# Patient Record
Sex: Male | Born: 1955 | Race: White | Hispanic: No | Marital: Married | Smoking: Current every day smoker
Health system: Southern US, Community
[De-identification: ages and names within clinical notes are randomized; demographics above are authoritative.]

## PROBLEM LIST (undated history)

## (undated) DIAGNOSIS — K7460 Unspecified cirrhosis of liver: Secondary | ICD-10-CM

## (undated) DIAGNOSIS — F32A Depression, unspecified: Secondary | ICD-10-CM

## (undated) DIAGNOSIS — E119 Type 2 diabetes mellitus without complications: Secondary | ICD-10-CM

## (undated) DIAGNOSIS — M79673 Pain in unspecified foot: Secondary | ICD-10-CM

## (undated) DIAGNOSIS — F419 Anxiety disorder, unspecified: Secondary | ICD-10-CM

## (undated) DIAGNOSIS — F329 Major depressive disorder, single episode, unspecified: Secondary | ICD-10-CM

## (undated) DIAGNOSIS — I10 Essential (primary) hypertension: Secondary | ICD-10-CM

## (undated) HISTORY — DX: Anxiety disorder, unspecified: F41.9

## (undated) HISTORY — DX: Essential (primary) hypertension: I10

## (undated) HISTORY — DX: Unspecified cirrhosis of liver: K74.60

## (undated) HISTORY — DX: Depression, unspecified: F32.A

## (undated) HISTORY — DX: Pain in unspecified foot: M79.673

## (undated) HISTORY — DX: Major depressive disorder, single episode, unspecified: F32.9

## (undated) HISTORY — DX: Type 2 diabetes mellitus without complications: E11.9

---

## 2000-10-21 ENCOUNTER — Encounter: Payer: Self-pay | Admitting: Emergency Medicine

## 2000-10-21 ENCOUNTER — Emergency Department (HOSPITAL_COMMUNITY): Admission: EM | Admit: 2000-10-21 | Discharge: 2000-10-22 | Payer: Self-pay | Admitting: Emergency Medicine

## 2002-02-07 ENCOUNTER — Emergency Department (HOSPITAL_COMMUNITY): Admission: EM | Admit: 2002-02-07 | Discharge: 2002-02-07 | Payer: Self-pay | Admitting: Emergency Medicine

## 2002-02-07 ENCOUNTER — Encounter: Payer: Self-pay | Admitting: Emergency Medicine

## 2002-11-28 ENCOUNTER — Emergency Department (HOSPITAL_COMMUNITY): Admission: EM | Admit: 2002-11-28 | Discharge: 2002-11-28 | Payer: Self-pay | Admitting: Emergency Medicine

## 2003-09-25 ENCOUNTER — Emergency Department (HOSPITAL_COMMUNITY): Admission: EM | Admit: 2003-09-25 | Discharge: 2003-09-25 | Payer: Self-pay | Admitting: Emergency Medicine

## 2005-04-11 ENCOUNTER — Emergency Department (HOSPITAL_COMMUNITY): Admission: EM | Admit: 2005-04-11 | Discharge: 2005-04-11 | Payer: Self-pay | Admitting: Emergency Medicine

## 2005-12-02 ENCOUNTER — Ambulatory Visit: Payer: Self-pay | Admitting: Critical Care Medicine

## 2005-12-02 ENCOUNTER — Inpatient Hospital Stay (HOSPITAL_COMMUNITY): Admission: EM | Admit: 2005-12-02 | Discharge: 2006-01-19 | Payer: Self-pay | Admitting: Emergency Medicine

## 2005-12-06 ENCOUNTER — Ambulatory Visit: Payer: Self-pay | Admitting: Oncology

## 2005-12-10 ENCOUNTER — Encounter (INDEPENDENT_AMBULATORY_CARE_PROVIDER_SITE_OTHER): Payer: Self-pay | Admitting: *Deleted

## 2005-12-12 ENCOUNTER — Encounter: Payer: Self-pay | Admitting: Vascular Surgery

## 2005-12-14 ENCOUNTER — Ambulatory Visit: Payer: Self-pay | Admitting: Gastroenterology

## 2005-12-29 ENCOUNTER — Ambulatory Visit: Payer: Self-pay | Admitting: Physical Medicine & Rehabilitation

## 2005-12-30 ENCOUNTER — Encounter (INDEPENDENT_AMBULATORY_CARE_PROVIDER_SITE_OTHER): Payer: Self-pay | Admitting: Specialist

## 2006-01-09 ENCOUNTER — Ambulatory Visit: Payer: Self-pay | Admitting: Hematology & Oncology

## 2006-01-20 ENCOUNTER — Ambulatory Visit: Payer: Self-pay | Admitting: Oncology

## 2006-05-14 ENCOUNTER — Ambulatory Visit (HOSPITAL_COMMUNITY): Admission: RE | Admit: 2006-05-14 | Discharge: 2006-05-14 | Payer: Self-pay | Admitting: Gastroenterology

## 2006-05-17 ENCOUNTER — Encounter: Admission: RE | Admit: 2006-05-17 | Discharge: 2006-05-17 | Payer: Self-pay | Admitting: Gastroenterology

## 2006-11-19 ENCOUNTER — Encounter: Admission: RE | Admit: 2006-11-19 | Discharge: 2006-11-19 | Payer: Self-pay | Admitting: Gastroenterology

## 2008-03-03 IMAGING — CR DG CHEST 1V PORT
1 series · 1 of 1 positions shown · non-contrast
Comparison: None.

CLINICAL DATA: 49-year-old.  Hemorrhagic shock.  PICC line placement. 
 PORTABLE CHEST - 1 VIEW:

[view not recorded]
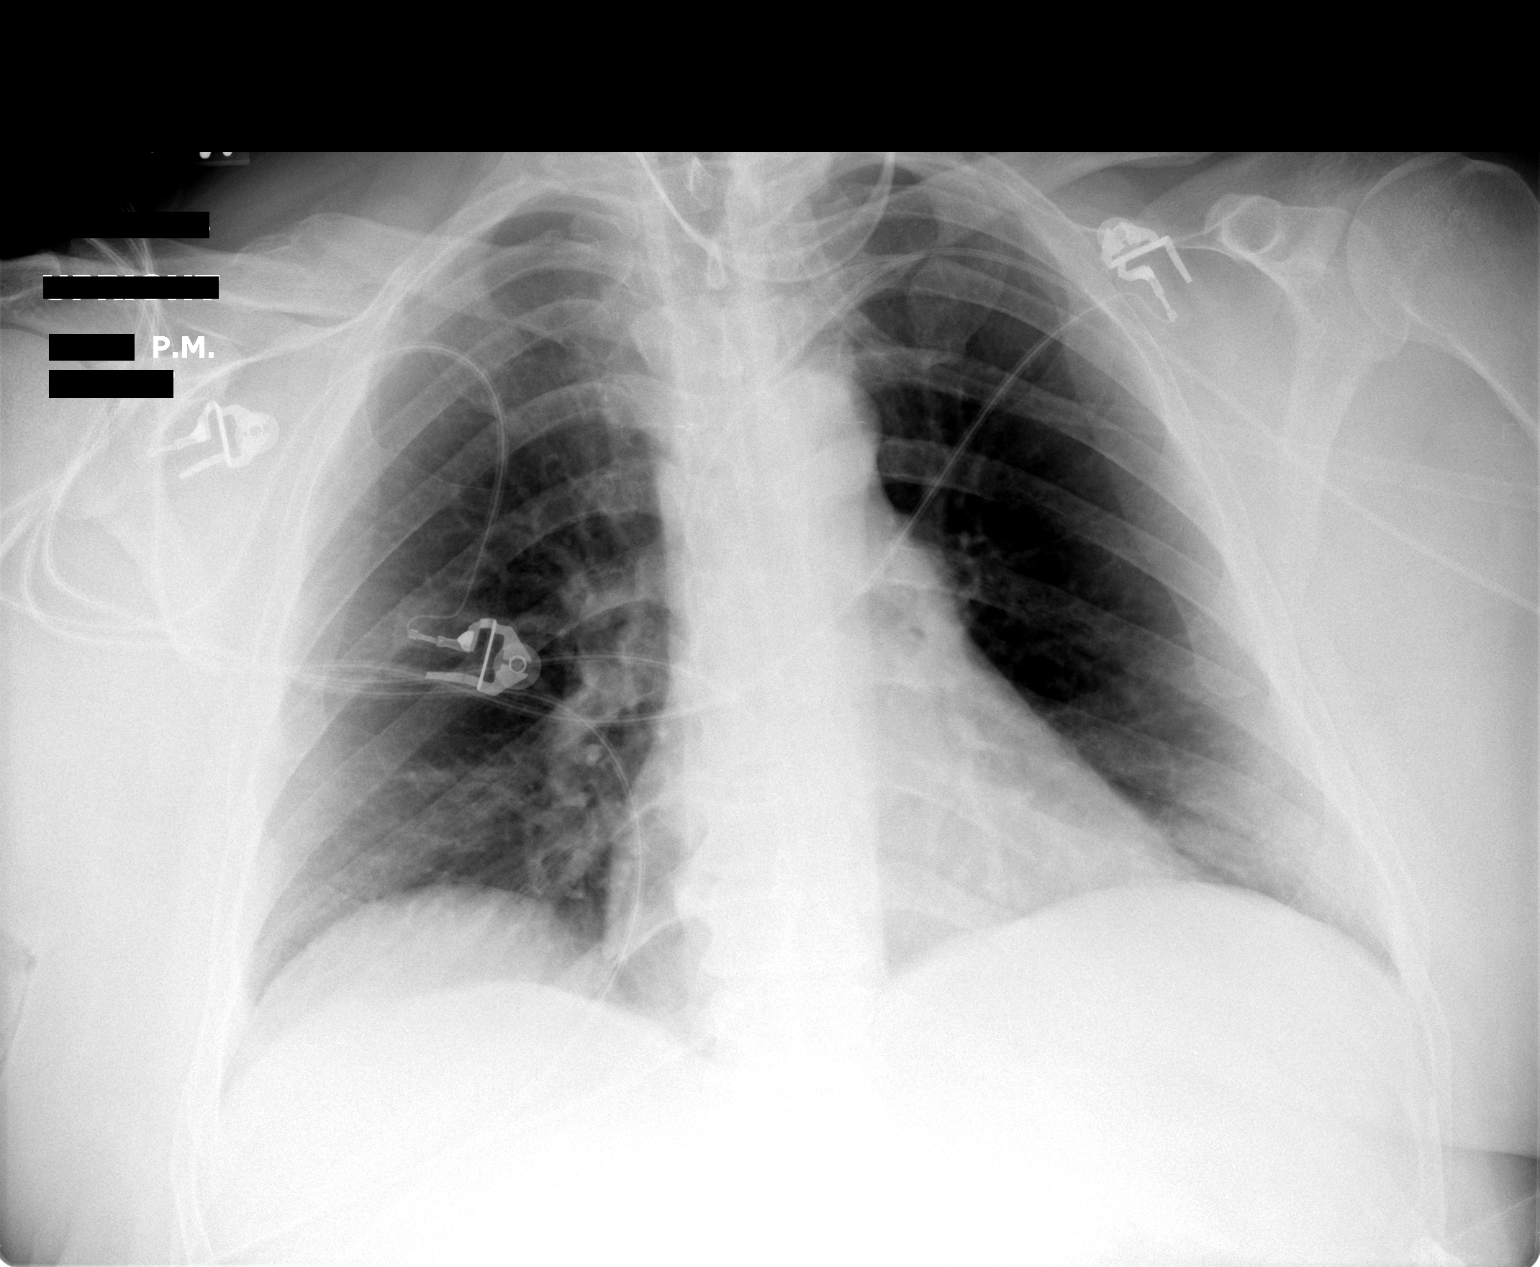

[1 of 1 positions shown; findings below may reference images not displayed]

FINDINGS: Cardiac silhouette, mediastinal, and hilar contours are within normal limits.  Lungs are clear of acute process.  Minimal streaky basilar atelectasis.  Mild eventration right hemidiaphragm.  Left PICC line is in the mid distal superior vena cava.
IMPRESSION: 1.  PICC line tip in good position in the mid distal superior vena cava.  
 2.  No acute pulmonary findings.  Minimal streaky basilar atelectasis.

## 2008-04-16 IMAGING — CR DG TOE GREAT 2+V*L*
1 series · 1 of 1 positions shown · non-contrast
Comparison: None.

CLINICAL DATA: Pain in left great toe.
 LEFT GREAT TOE ? 2 VIEWS:

[view not recorded]
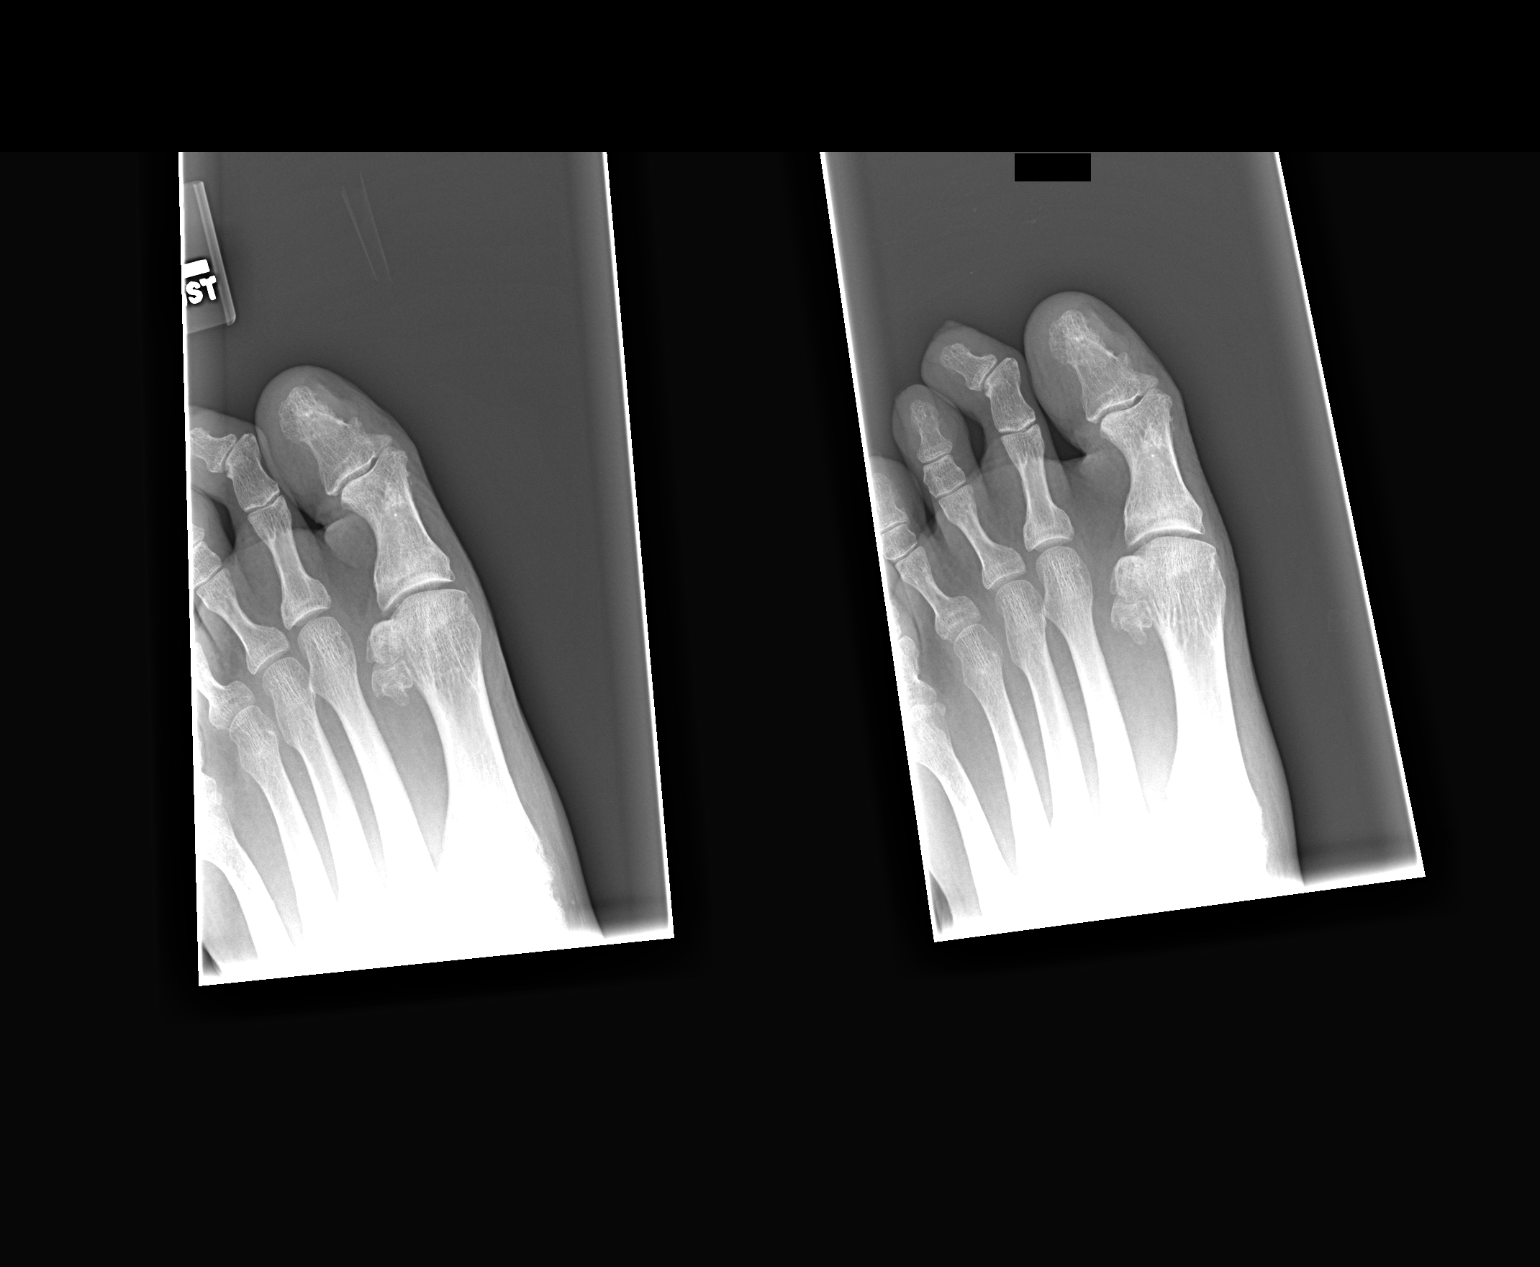

[1 of 1 positions shown; findings below may reference images not displayed]

FINDINGS: There are mild degenerative changes affecting the interphalangeal joint of the great toe.  No acute radiographic abnormalities are noted.  Specifically, I see no fractures or dislocations.
IMPRESSION: No acute findings.

## 2008-04-16 IMAGING — CR DG PORTABLE PELVIS
1 series · 1 of 1 positions shown · non-contrast
Comparison: None available.

CLINICAL DATA: Fall.  Hemorrhagic shock.  
 PORTABLE PELVIS ? 1 VIEW:

[view not recorded]
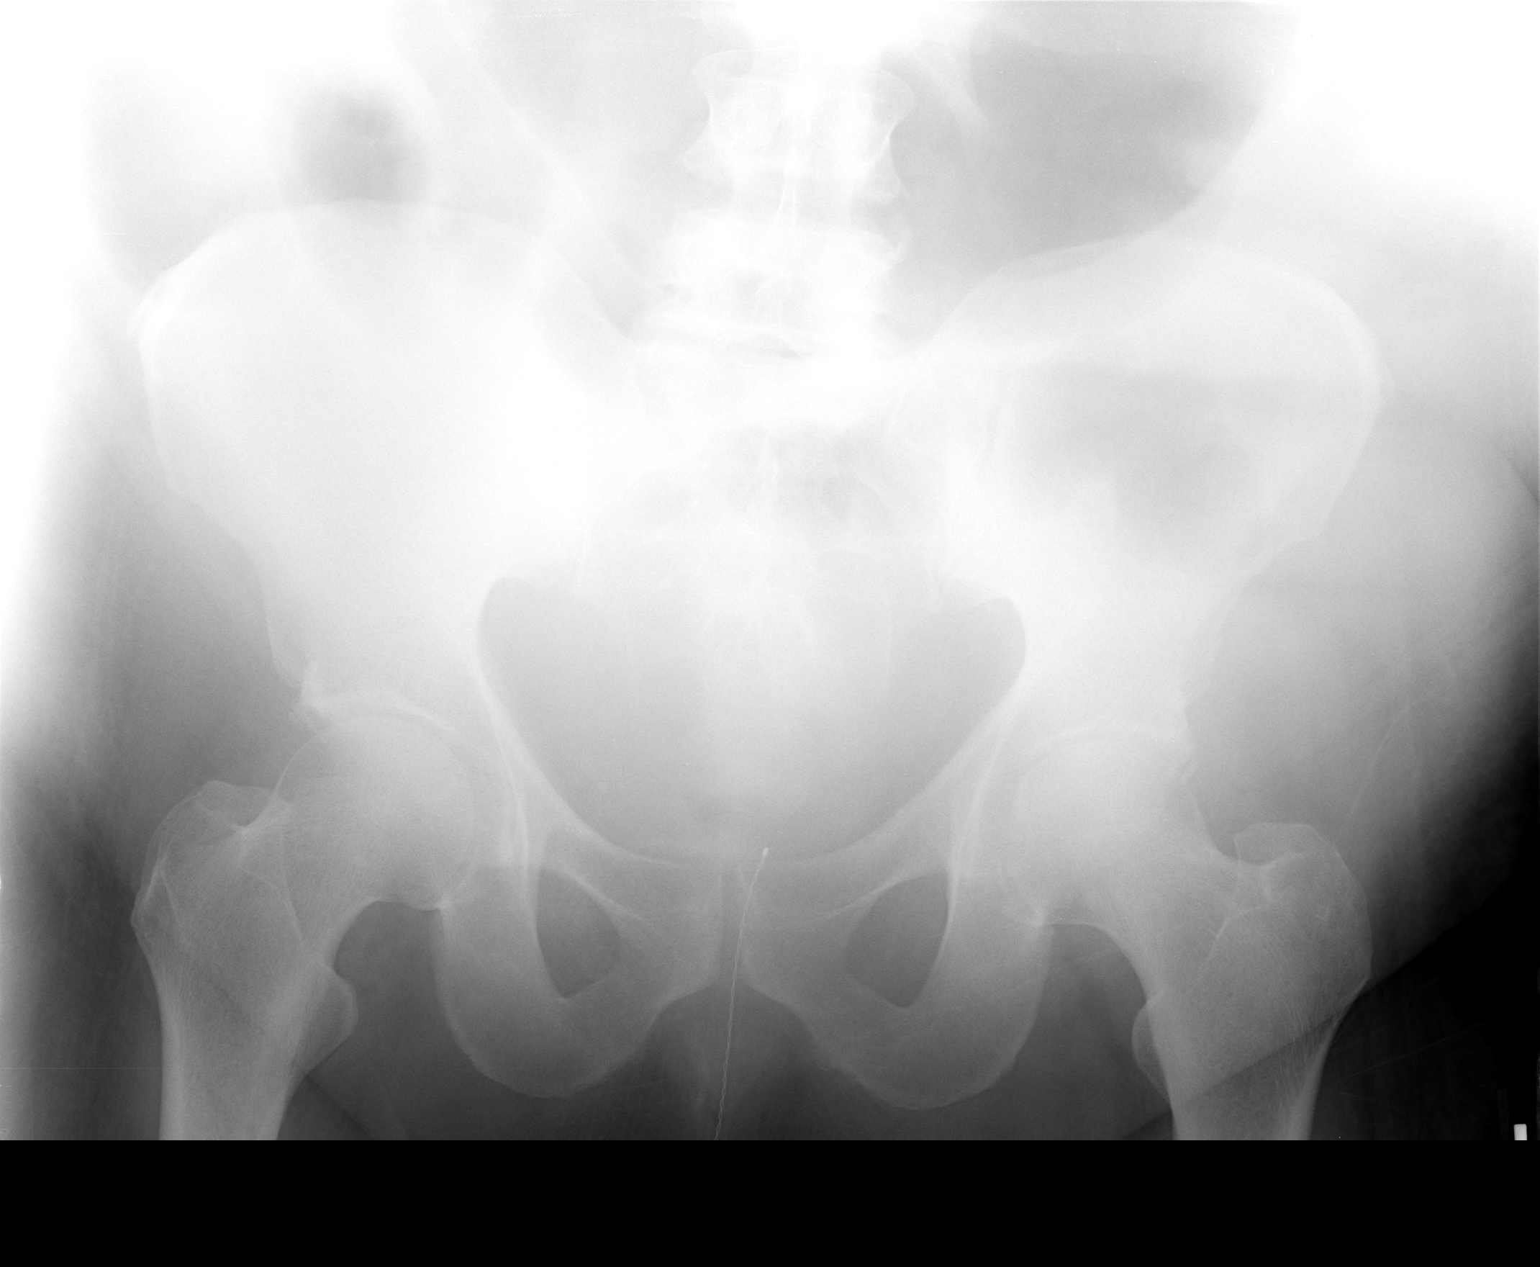

[1 of 1 positions shown; findings below may reference images not displayed]

FINDINGS: Exam is limited by patient?s body habitus. There are degenerative changes at the lumbosacral junction.  Both femoral heads are located.  Mild degenerative changes involve bilateral hips.
IMPRESSION: 1.  Moderately limited examination due to patient?s size.  No displaced fracture or dislocation.  
 2.  Lower lumbar spondylosis and degenerative changes of the hips.

## 2008-09-06 LAB — BASIC METABOLIC PANEL: Glucose: 159 mg/dL

## 2008-09-06 LAB — CBC AND DIFFERENTIAL: Hemoglobin: 13.4 g/dL — AB (ref 13.5–17.5)

## 2008-09-06 LAB — HEPATIC FUNCTION PANEL: AST: 46 U/L — AB (ref 14–40)

## 2009-11-27 LAB — HEPATIC FUNCTION PANEL: Alkaline Phosphatase: 2 U/L — AB (ref 25–125)

## 2010-04-26 ENCOUNTER — Ambulatory Visit (HOSPITAL_COMMUNITY): Admission: RE | Admit: 2010-04-26 | Discharge: 2010-04-26 | Payer: Self-pay | Admitting: Family Medicine

## 2010-08-31 ENCOUNTER — Encounter: Payer: Self-pay | Admitting: Emergency Medicine

## 2010-12-26 NOTE — H&P (Signed)
[name], MARSZALEK NO.:  [id number]   MEDICAL RECORD NO.:  [id number]          PATIENT TYPE:  INP   LOCATION:  1825                         FACILITY:  [facility]   PHYSICIAN:  Jonna L. Robb Matar, M.D.DATE OF BIRTH:  01/19/1956   DATE OF ADMISSION:  12/02/2005  DATE OF DISCHARGE:                                HISTORY & PHYSICAL   PRIMARY CARE PHYSICIAN:  Dr. Feliciana Rossetti   CHIEF COMPLAINT:  Nose bleeds for five days.   HISTORY:  The patient actually had an episode of epistaxis back in September  which has been intermittent since then.  However, last week he injured his  right arm with a huge bruise.  Five days ago he fell and injured his last  knee and that also bruised up and he has been having intermittent nose  bleeds to the point where four days ago he bled almost all of that day from  his nose.  He has been getting a little bit dizzy and lightheaded.  Also of  note, he was started on Lipitor about three or four weeks ago, was on it for  three weeks and then told to discontinue it.   PAST MEDICAL HISTORY:  1.  Type 2 diabetes.  2.  Alcoholism.  3.  Hypertension.   FAMILY HISTORY:  Hypertension and diabetes.   SOCIAL HISTORY:  Smokes a pack a day for over 30 years.  Alcohol intake is a  fifth of liquor a day plus one to two six-packs a day but he has not been  drinking in the past week.  He works maintenance.  Is married.  Has no  children.  Has no drug allergies.   MEDICATIONS:  1.  Benicar 40/25 daily.  2.  Metformin 500 daily.  3.  Just the last couple of days he got a prescription for some Valium and      for some vitamin K.   REVIEW OF SYSTEMS:  The patient feels a little bit dizzy.  His wife says she  has noticed he has been jaundiced for about three or four days.  He denies  hematuria.  No thyroid problems.  No heart disease.  Not been told of liver  problems.  Patient denies DTs.   PHYSICAL EXAMINATION:  GENERAL:  Well-nourished,  well-developed, very  jaundiced white male with blood trickling out of his right nostril.  HEENT:  Extraocular movements are full.  Dentition is fair.  There is no  thyromegaly, carotid bruits, or jugular venous distention.  LUNGS:  Clear to A&P without wheezing, rales, rhonchi, or dullness.  Respiratory effort is slightly increased.  HEART:  Tachycardic.  Normal S1, S2 without murmurs, rubs, or gallops.  There is no clubbing, cyanosis, edema.  ABDOMEN:  Slightly distended.  I cannot detect any hepatomegaly.  Bowel  sounds are present.  Stool is heme-positive.  PELVIC:  External genitalia is mature.  EXTREMITIES:  Huge ecchymosis from the right hand all the way up to the  right shoulder, another bruise on the left shoulder, another bruise left  knee.  There is  full range of motion, some mild arthritic changes.  NEUROLOGIC:  There is no tremor.  The patient is alert and oriented x3,  seems anxious.  He is alert and oriented x3.  Reflexes are 2+.  Toes go  down.   INITIAL LABORATORY WORK:  Some of the laboratory work is from today and some  from Dr. Jearl Klinefelter office yesterday.  White count 9.5, hemoglobin down to  5.1, platelet count only 68.  A+ blood.  INR 1.8.  PTT 39.  Other laboratory  work is pending.  From yesterday it is noted his bilirubin was 5.4.  Bilirubin one month prior was 2.9.   IMPRESSION:  1.  Hemorrhagic shock.  The patient's blood pressure is improving with bolus      and normal saline.  2.  __________ coagulopathy.  The patient will be transfused and given fresh      frozen plasma.  3.  Thrombocytopenia.  4.  Severe anemia secondary to acute and chronic blood loss.  5.  Alcoholism.  Discussed that the patient will need to get on to some      alcohol treatment program.  6.  Alcoholic hepatitis.  7.  History of hypertension.  Obviously, all his antihypertensives will be      held.  8.  Type 2 diabetes.  I am going to hold his Metformin and put him on a mild       sliding scale insulin as needed.      Jonna L. Robb Matar, M.D.  Electronically Signed     JLB/MEDQ  D:  12/02/2005  T:  12/02/2005  Job:  [id number]   cc:   Lacretia Leigh. Quintella Reichert, M.D.  [address]  [place]  [place] [address]

## 2010-12-26 NOTE — Discharge Summary (Signed)
[name], [name] NO.:  [id number]   MEDICAL RECORD NO.:  [id number]          PATIENT TYPE:  INP   LOCATION:  2909                         FACILITY:  [facility]   PHYSICIAN:  Hillery Aldo, M.D.   DATE OF BIRTH:  10-18-1955   DATE OF ADMISSION:  12/02/2005  DATE OF DISCHARGE:                                 DISCHARGE SUMMARY   INTERIM DISCHARGE SUMMARY   PRIMARY CARE PHYSICIAN:  Dr. Tinnie Gens C. Hooper   DISCHARGE DIAGNOSES:  1.  End-stage liver disease/cirrhosis.  2.  Hemorrhagic shock.  3.  Coagulopathy secondary to #1.  4.  Thrombocytopenia secondary to #1.  5.  History of alcohol abuse.  6.  Portal hypertension and varices.  7.  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  8.  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  9.  Acute renal failure.  10. Coagulase-negative Staphylococcus bacteremia.  11. Status post elective intubation for airway protection.  12. Dysphagia.  13. Hyperglycemia.  14. Hypernatremia.  15. Hyperchloremia  16. Anal fissure.  17. Gastrointestinal bleed secondary to ischemic colitis.  18. Stage 2 decubitus ulcer.  19. Anasarca.   DISCHARGE MEDICATIONS:  To be dictated at time of actual discharge.   CONSULTATIONS:  1.  Dr. Yvonna Alanis of orthopedics.  2.  Dr. Pierce Crane of hematology.  3.  Dr. Flo Shanks of ENT.  4.  Dr. Antonietta Breach of psychiatry.  5.  Dr. Juanda Chance of gastroenterology.   For complete list of the history of present illness, procedures and  diagnostic studies, and hospital course through Dec 15, 2005, please see the  previously dictated interim discharge summary.  This interim summary will  cover the period of time from Dec 26, 2005, through January 09, 2006.   ADDITIONAL PROCEDURES AND DIAGNOSTIC STUDIES:  1.  Swallowing evaluation on Dec 25, 2005, showed moderate oropharyngeal      dysphagia.  There was laryngeal penetration to vocal cords with honey      thick  and delayed cough secondary to reduced laryngeal elevation.  There      was flash penetration observed with honey via the cup while performing a      chin tuck.  Recommendations were made for a dysphagia 1 pureed diet with      honey thickened liquids and full supervision with aspiration      precautions.  All medications are to be crushed and given in pureed      texture.  He is to avoid straws and remain seated upright 90 degrees for      p.o. intake.  2.  Flexible sigmoidoscopy on Dec 30, 2005, done by Dr. Lina Sar showed a      large anal fissure which was not bleeding at the time of the exam.      There was evidence of ischemic colitis in the right colon and bleeding      throughout the transverse and descending colon.  Biopsies were taken      with the results showing inflamed granulation tissue with  no      identifiable mucosal surface.  The differential by pathologic criteria      included ischemic colitis versus NSAID-related mucosal injury.   DISCHARGE LABORATORY VALUES:  To be dictated at the time of actual  discharge.   HOSPITAL COURSE:  #1 - ELECTIVE INTUBATION FOR AIRWAY  PROTECTION/RESPIRATORY.  The patient had been extubated on Dec 23, 2005.  He  was initially intubated for airway protection secondary to encephalopathy  and epistaxis.  Once extubated, the patient's respiratory status remained  stable and he maintained good O2 saturation on nasal cannula oxygen.  He  remains at high risk for aspiration and decompensation but a he is stable  through January 09, 2006.   #2 - ACUTE BLOOD LOSS ANEMIA SECONDARY TO COAGULOPATHY, THROMBOCYTOPENIA,  AND ISCHEMIC COLITIS.  The patient had a very rocky course over the past 2  weeks with recurrent episodes of hematochezia and significant blood loss  anemia.  He had to be frequently transfused with packed red blood cells,  fresh frozen plasma, cryoprecipitate, platelets, and also received NovoSeven  on more than one occasion.  His  hemoglobin would stabilize and then he would  have an episode of re-bleeding.  At this time, he has been stable with no GI  bleeding for approximately 3 days.  He is currently on Amicar by the  recommendations of the hematologist and this may be helping with hemostasis.  He continues to require aggressive support with fresh frozen plasma for his  significant coagulopathy.  His thrombocytopenia has remained stable.  The  plan at this point is to transfuse for any hemoglobin of less than 8, to  transfuse fresh frozen plasma as needed for coagulopathy, and to transfuse  platelets if his platelet count is less than 50 with an episode of active  bleeding.   #3 - CIRRHOSIS/END-STAGE LIVER DISEASE/HYPERAMMONEMIA.  The patient was  taken off lactulose secondary to concerns that this may be contributing to  his ongoing problem with GI bleeding.  He was put on neomycin with good  control of his hyperammonemia.  He was not encephalopathic for the past 2  weeks.  Once he was hemodynamically stable, a spironolactone was added.  At  this point, we are attempting to titrate this along with Lasix for optimal  management of his significant anasarca.   #4 - HYPERGLYCEMIA.  The patient has had episodes of hyperglycemia.  When  his condition deteriorated, the sliding scale insulin and CBG checks were  discontinued due to patient refusing.  At this point, we have been  monitoring his morning glucose which has been fine.  We have not  reinstituted sliding scale insulin but consideration could be made for  restarting this if indicated.   #5 - DYSPHAGIA.  The patient was seen by the speech therapist who  recommended a dysphagia diet with honey thickened liquids.  He remains weak  and at high risk for aspiration.   #6 - DECUBITUS ULCER/ANAL FISSURE.  The patient has a stage 2 decubitus  ulcer and a nonbleeding anal fissure.  He was put on Anusol and the nursing staff cares for his decubitus ulcer.  We also  placed him on a KinAir bed to  help prevent any further deterioration of his skin.   #7 - FLUID/ELECTROLYTE/NUTRITION.  The patient has had various electrolyte  imbalances all of which were addressed as appropriate.  His electrolytes are  currently stable.  We will continue to monitor then periodically.   #8 -  ANASARCA.  Once the patient was hemodynamically stable, attempts were  made to discontinue his IV fluids and restart his diuretic therapy.  At this  point, we have reinstituted spirolactone and will began to try low-dose  Lasix with titration upward for relief of his significant anasarca.   DISPOSITION:  The patient remains in the step-down unit and continues to  want very aggressive care.  An ethics committee meeting was held when the  patient deteriorated significantly and had hemorrhagic shock with recurrent  episodes of GI bleeding.  At that point, it was unclear that we would be  able to stop the bleeding and it was felt that in the event of such  occurrence, the patient would likely go into cardiopulmonary arrest.  Because his risk of surviving a code situation was essentially limited, an  ethics committee meeting was held because of insistence by the part of the  patient's wife that we do everything possible to save her husband's life.  During the meeting, the patient signed over power of attorney to his wife  and she continues to want full support in the event of a catastrophic event.  Given this, we have continued to support the patient with multiple blood  products and step-down unit level of care.  Despite these interventions, the  patient's prognosis remains very poor in that he has advanced end-stage  liver disease and the risk of a bleeding episode is magnified.      Hillery Aldo, M.D.  Electronically Signed     CR/MEDQ  D:  01/10/2006  T:  01/11/2006  Job:  [id number]   cc:   Lacretia Leigh. Quintella Reichert, M.D.  [address]  [place]  [place] [address]

## 2010-12-26 NOTE — Op Note (Signed)
[name], WESTENBERGER NO.:  [id number]   MEDICAL RECORD NO.:  [id number]          PATIENT TYPE:  INP   LOCATION:  5713                         FACILITY:  [facility]   PHYSICIAN:  Loreta Ave, M.D. DATE OF BIRTH:  08/29/1955   DATE OF PROCEDURE:  12/06/2005  DATE OF DISCHARGE:                                 OPERATIVE REPORT   PREOPERATIVE DIAGNOSIS:  Marked hematoma with distal edema, right arm  secondary to hepatic coagulopathy.   POSTOPERATIVE DIAGNOSIS:  Marked hematoma with distal edema, right arm  secondary to hepatic coagulopathy with a large hematoma under pressure  mostly within the short head biceps tendon.   OPERATION PERFORMED:  Evacuation, irrigation, removal hematoma right arm.   SURGEON:  Loreta Ave, M.D.   ASSISTANT:  Aura Fey. Bobbe Medico.   ANESTHESIA:  General.   ESTIMATED BLOOD LOSS:  Minimal except for the drain hematoma.   SPECIMENS:  None.   CULTURES:  None.   COMPLICATIONS:  None.   DRESSING:  Soft compressive with sling and a compressive wrap from the wrist  up to the shoulder.   DESCRIPTION OF PROCEDURE:  The patient was brought to the operating room and  after adequate anesthesia had been obtained, placed in beach chair position  on the shoulder positioner and prepped and draped in the usual sterile  fashion.  I made a longitudinal incision just over the short head biceps  tendon just to front margin of the deltoid.  Skin and subcutaneous tissue  divided just below the deltopectoral groove.  Blunt dissection used to  expose the biceps right over the short head.  This was opened longitudinally  and evacuation of a large hematoma that was under pressure although mostly  solid.  This was very consistent with what they saw on the scan.  This was  thoroughly evacuated protecting neurovascular structures.  Once I had  cleared out all of the new and old hematoma from that area, the entire area  was much softer.  Compression on  all of the venous and neurovascular  structures much improved after removal of the hematoma. Fortunately, there  was no  necrotic material and all the muscle around this appeared to be  viable.  Copious irrigation with a pulse lavage.  When that was complete, we  then closed the subcutaneous tissue and the skin with staples.  I did not  place a drain deep because his acute bleeding hopefully had completely  resolved with correction of his coagulopathy.  I felt placing a drain may  place him at more risk for  bleeding than not.  Once the dressing was in place, we then placed a  compressive wrap on the hand all the way up to the wound.  When that was  complete, anesthesia reversed.  Brought to recovery room.  Tolerated surgery  well without complication.      Loreta Ave, M.D.  Electronically Signed     DFM/MEDQ  D:  12/06/2005  T:  12/07/2005  Job:  [id number]

## 2010-12-26 NOTE — Consult Note (Signed)
NAMEBRAIDEN, Simpson NO.:  [id number]   MEDICAL RECORD NO.:  [id number]          PATIENT TYPE:  INP   LOCATION:  5713                         FACILITY:  [facility]   PHYSICIAN:  Pierce Crane, M.D.      DATE OF BIRTH:  [birth year]   DATE OF CONSULTATION:  12/05/2005  DATE OF DISCHARGE:                                   CONSULTATION   HEMATOLOGY-ONCOLOGY CONSULTATION:   HISTORY OF PRESENT ILLNESS:  This is a 55 year old gentleman with well known  history of alcohol abuse who presents with several-day history of epistaxis  with a hemoglobin of 5.  He had previously had this cauterized as an  outpatient and came into the hospital when he was unable to slow the  bleeding down.  He was seen in the emergency room and had his nose packed by  Dr. Lazarus Salines in hopes this would slow his bleeding down.  He was also noted  to have some pain and swelling of his right arm.  About 2-3 weeks ago he had  a pulled a fence post out of the ground and he developed some pain in his  arm and then a few days ago this began to swell quite extensively.  He  subsequently has had while in hospital at least 8-9 units of packed red  blood cells.  He has had about 4-6 units of fresh frozen plasma, vitamin K  x1, despite that coagulation status continues to be fully  abnormal and he  has ongoing thrombocytopenia.   PAST MEDICAL HISTORY:  History of hypertension, type 2 diabetes.   MEDICATIONS:  Benicar and metformin.   SOCIAL HISTORY:  He smokes a pack a day for the past 30 years.  His alcohol  intake is about a fifth of liquor a day for as many years as he can  remember.  He is married and has no children.  He has__________  .  No  history of bleeding diathesis.   ALLERGIES:  NO DRUG ALLERGIES.   REVIEW OF SYSTEMS:  He denies any pruritus, denies any headaches, no blurry  vision, denies any shortness of breath or cough, he has been swallowing  quite a blood, he is somewhat nauseated and has  __________, abdominal pain,  denies any blood per rectum, denies any hematuria or any other problems with  bleeding.   PHYSICAL EXAMINATION:  GENERAL:  This is a pleasant gentleman who is anxious  and in some distress.  VITAL SIGNS:  Blood pressure is 178/56, temperature is 98.6, pulse 98,  respiratory rate 20.  HEENT:  He has scleral icterus.  He has slight jaundice.  He has no palpable  peripheral adenopathy.  LUNG:  Clear.  HEART:  Heart sounds are normal.  EXTREMITIES:  His right arm is diffusely swollen and ecchymotic.  He can  raise the arm somewhat and he has moderate hand grips.  He has good pulses  distally.  His left arm is normal.  He has no palpable hepatosplenomegaly,  no adenopathy, minimal peripheral edema.  He has no obvious petechiae on his  lower extremities.   LABORATORIES:  Currently after his blood products his CBC shows a white  count of 6.5, hemoglobin of 10 and platelet count of 64,000.  Post-  transfusion platelets is now 79,000.  DIC panels are done.  INR is 1.9, PT  is 21.9, PTT of 39 and fibrinogen 354, D-dimer is 13.6.  Schistocytes were  not seen on smear.  His __________.  His chemistries show bilirubin of 6.8,  AST and ALT are somewhat elevated at 23 and 43, respectively.   IMPRESSION:  [name] is an unfortunate gentleman who presents with  coagulopathy secondary to alcohol use.  I should note that a CT scan of the  abdomen and pelvis has shown evidence of cirrhosis with evidence of portal  hypertension and varices seen.  He currently has had multiple blood products  with some improvement of his hemoglobin.  Unfortunately he remains  coagulopathic with an elevated INR.  He continues to have some intermittent  epistaxis despite having this area cauterized x2.  He has been seen in  consultation by orthopedics.  There is a concern that he has a large  hematoma and an MRI scan has been planned.  The orthopedic surgeons are  concerned about taking him to  the emergency room for evacuation of the  hematoma as he is at somewhat increased risk for bleeding.  I discussed with  him the use of Novo-7 possibly to slow this process down and stabilize the  hematoma.  This can also be used pre- and postop to control bleeding so that  he would be able to have this area evacuated.  We discussed the small risk  of using this medication in terms of possibility of a stroke.  We will await  results of the MRI and then make decision later on about the use of Novo-7.      Pierce Crane, M.D.  Electronically Signed     PR/MEDQ  D:  12/05/2005  T:  12/05/2005  Job:  [id number]   cc:   Lacretia Leigh. Quintella Reichert, M.D.  [address]  [place] [address]   Loreta Ave, M.D.  Fax: [phone]   Gloris [place]. Lazarus Salines, M.D.  Fax: [phone]

## 2010-12-26 NOTE — Discharge Summary (Signed)
[name], [name] NO.:  [id number]   MEDICAL RECORD NO.:  [id number]          PATIENT TYPE:  INP   LOCATION:  5713                         FACILITY:  [facility]   PHYSICIAN:  Isidor Holts, M.D.  DATE OF BIRTH:  July 20, 1956   DATE OF ADMISSION:  12/02/2005  DATE OF DISCHARGE:                                 DISCHARGE SUMMARY   PMD:  Lacretia Leigh. Quintella Reichert, M.D.   For diagnoses, refer to interim discharge summary dated December 06, 2005, by  Dr. Mamie Levers.  In addition, the following is pertinent:  Porta-  systemic encephalopathy.   PROCEDURE:  1.  Abdominal ultrasound scan dated Dec 11, 2005.  This showed a tiny amount      of perihepatic ascites.  No drainable collection present.  Findings      consistent with known cirrhosis.  Limited study due to the presence of a      large amount of bowel gas.  2.  Upper GI endoscopy dated Dec 10, 2005 by Dr. Melvia Heaps.  This showed      erosions in the duodenal bulb, erythematous mucosa, friable mucosa,      edema present.  Biopsy taken.  In the body of the stomach, there were      erosions present.  These were few and superficial with old clots.  There      was also retained food found in the body of the stomach.  Findings were      consistent with duodenitis without hemorrhage, gastritis chronic.      Pathology report confirmed chronic peptic duodenitis.  No evidence for      H. Pylori.   CONSULTATIONS:  1.  Dr. Pierce Crane, hematology/oncology.  2.  Dr. Melvia Heaps, gastroenterology.  3.  Dr. Arline Asp, hematology.  4.  Dr. Mckinley Jewel, orthopedic surgeon.   For admission history and detailed clinical course, refer to interim  discharge summary dated December 06, 2005 by Dr. Mamie Levers. However, in  the succeeding few days, i.e., from December 07, 2005 to Dec 15, 2005, the  following are pertinent:  The patient underwent operative evacuation of  right upper extremity hematoma on December 06, 2005 by Dr. Mckinley Jewel,  orthopedic surgeon.  This was an uncomplicated procedure.  For details,  refer to operative note of December 06, 2005, dictated by Dr. Eulah Pont.  The  patient was managed with elevation of the affected upper extremity.  Pain  and swelling subsided.  Overall, no further bleeding episodes were noted.  Epistaxis resolved.  The patient's hemoglobin level trended down, remaining  relatively stable, averaging 8.0 and 8.7.  On Dec 09, 2005, he was transfused  with 1 unit PRBCs, resulting in a post transfusion hemoglobin level of 8.9.  Hemoglobin level has remained relatively stable around this number, ever  since.  The patient continues to have tense abdominal distention which  appears to be gaseous, although he is able to move his bowels without any  problem.  At some point, it was felt this may be secondary to significant  ascites.  Abdominal  ultrasound scan was therefore ordered, with a view to  performing a paracentesis and instituting diuretic treatment, should this be  marked ascites. However, abdominal ultrasound scan of Dec 11, 2005 showed  only a tiny amount of perihepatic ascites, with no drainable collection  present.  Gastroenterologic consultation was requested, which was kindly  provided by Dr. Melvia Heaps, who recommended no diuretic treatment since  there was no ascites, however, he agreed with management of the patient so  far, with beta blockade against a background of cirrhosis and portal  hypertension, as well as lactulose for porto-systemic encephalopathy, as  evidenced by the patient's depressed sensorium/drowsiness and  hyperammonemia.  He performed an EGD on Dec 10, 2005, which revealed chronic  gastritis and duodenitis.  Biopsies were taken, but pathology report was  negative for H. Pylori.  There was no active bleeding.  Other subject of  concern is, of course, the presence of a right mid colonic lesion.  It was  noted on prior abdominal CT scan.  Per gastroenterology,  it was thought the  patient may not be able to tolerate a colonoscopy.  Conservative approach is  therefore recommended, and this will necessitate barium enema examination.  This is being arranged, and of course findings will dictate the next step.   On Dec 12, 2005, the patient was noticed to have marked swelling/fluctuance  of the left leg, with extensive bruising posteriorly up to the thigh.  The  right leg was less edematous.  This raised the possibility of an old  hematoma in the affected extremity, although there appeared to be no  vascular compromise.  Pulses were palpable.  Patient did not complain of  numbness or tingling.  Lower extremity venous Dopplers were done, and this  showed no evidence of DVT, or obvious hematoma bilaterally.  The patient's  blood pressure has been well controlled during the course of his hospital  stay.  There has been no overt bleeding.  INR remains elevated at > 2,  although the patient has had courses of vitamin K.  Renal indices have  remained stable.  As of Dec 12, 2005, BUN was 35 with a creatinine of 1.1.   DISPOSITION:  This will be elucidated in detail, at the time of actual  discharge, by discharging MD. However, overall prognosis appears to be poor,  both in the immediate and the long term.  This is compounded by the fact  that the patient is not entirely compliant with his lactulose and continues  intermittently refusing this, militating against resolution of his  encephalopathy.  The poor prognostic outlook has been discussed with the  patient's family.  Concerns have been raised, about the fact that the  patient will certainly not be able to go back to his regular occupation and  will need disability.  Also, they are somewhat concerned about paying  hospital bills.  Social worker consultation has therefore been requested.  The social worker has made preliminary contact with the family and will work with them to address these issues.   Furthermore, due to obvious unfavorable  prognosis, the time has probably come to start thinking about disposition of  this patient, as it appears that no dramatic recovery is in the offing.  I  have had preliminary discussions with the patient's spouse, about the  possibility of SNF versus hospice or in the  alternative,  considering a SNF, and then should patient further  deteriorate, transitioning to hospice.  It is, of course, going  to be  necessary to have a discussion with the patient himself, at some point.  This discussion was held with the patient's spouse, [name].  Her  telephone numbers are [phone] and [phone].      Isidor Holts, M.D.  Electronically Signed     CO/MEDQ  D:  12/15/2005  T:  12/15/2005  Job:  [id number]   cc:   Lacretia Leigh. Quintella Reichert, M.D.  [facility]  [place] [address]   Pierce Crane, M.D.  Fax: [phone]   Samul Dada, M.D.  Fax: [phone]   Loreta Ave, M.D.  Fax: [phone]   Barbette Hair. Arlyce Dice, M.D. [facility]  [address]  [place]  [place] [address]

## 2010-12-26 NOTE — Discharge Summary (Signed)
NAMEBELEN, ZWAHLEN NO.:  [id number]   MEDICAL RECORD NO.:  [id number]          PATIENT TYPE:  INP   LOCATION:  6731                         FACILITY:  [facility]   PHYSICIAN:  Isidor Holts, M.D.  DATE OF BIRTH:  02-11-1956   DATE OF ADMISSION:  12/02/2005  DATE OF DISCHARGE:  01/19/2006                                 DISCHARGE SUMMARY   PRIMARY MEDICAL DOCTOR:  Lacretia Leigh. Quintella Reichert, M.D., [facility],  [facility], telephone number [phone].   For discharge diagnoses, refer to interim discharge summary dated January 10, 2006, by Hillery Aldo, M.D.   DISCHARGE MEDICATIONS:  1.  Folic acid 1 mg p.o. daily.  2.  Protonix 40 mg p.o. daily.  3.  Thiamine 100 mg p.o. daily.  4.  Multivitamin one p.o. daily.  5.  Anusol-HC rectal suppository, insert one in the rectum b.i.d.  6.  Lactulose 30 mL p.o. b.i.d.  7.  Amicar 1000 mg p.o. q.i.d.  8.  Aldactone 50 mg p.o. t.i.d.  9.  Vitamin K 1 mg p.o. daily.  10. Atenolol 25 mg p.o. daily.  11. Ciprofloxacin 750 mg p.o. weekly (on Fridays).  12. Lasix 80 mg p.o. b.i.d.  13. K-Dur 20 mEq p.o. daily.  14. Ensure chocolate pudding 113 mL p.o. three times daily as nutritional      supplement.  15. Dysphagia III diet with nectar-thick.   For procedures, refer to interim discharge summaries dated December 06, 2005,  by [place] L. Robb Matar, M.D., Dec 15, 2005, by Isidor Holts, M.D., and January 10, 2006, by Dr. Hillery Aldo.   For consultations, refer to interim discharge summary dated January 10, 2006, by  Dr. Hillery Aldo.   For admission history and detailed clinical course, refer to above-mentioned  interim discharge summaries.  In addition, the following are pertinent, for  the period from January 11, 2006, until the date of this dictation.  The  patient's clinical condition remained relatively stable.  He has continued  on vitamin supplements and Amicar per recommendations of hematologist and  his sacral  decubitus ulcer was managed with local care by the wound  management team.  As a matter of fact, the wound care nurse evaluated the  wound on January 19, 2006, and felt that there was 100%  yellow slough with  yeast at the wound edges.  She further discussed home care for the wound,  with the patient's wife and informed her of the plan of care for dressing  change.  Recommended resuming Accuzyme, until the wound bed is clean and  cover the dressings with Tegaderm in order to prevent stool draining into  the wounds, also antifungal powder to surrounding areas was recommended.  Speech pathologist has also evaluated patient and has recommended a D-III  dysphagia diet with nectar-thick.  The patient continues on diuretics for  anasarca and ascites.  As a matter of fact, on January 18, 2006, his Lasix was  increased to 80 mg twice a day because of this.  On January 19, 2006, there was  appreciable diminution  in lower extremity edema, although his abdomen  continues to remain distended with dilated superficial veins, consistent  with portal hypertension and ascites.  Potassium supplements have been added  to his treatment regimen in view of the large dose of diuretics, however, it  is clear that his primary MD will have to follow his electrolytes, monitor  the patient's weight and adjust medications as appropriate.  There has been  no relapse of bleeding with epistaxis, or otherwise during the above-  mentioned period, although the patient continues to show hematologic  features of coagulopathy, including an elevated INR, on January 19, 2006, this  was 2.2, and also thrombocytopenia.  The patient is, of course,  deconditioned, however, he is undergoing physical therapy/occupational  therapy, which will be carried out in the home environment per patient's  wishes.  The patient shows no clinical features of encephalopathy at the  present time, i.e., no asterixis, no constructive dyspraxia.  He appears  fully  alert and oriented and able to communicate effectively.  Ammonia level  on January 19, 2006, was 87.  He continues on lactulose in b.i.d. dosage.   DISPOSITION:  The patient was considered clinically stable and sufficiently  recovered to be discharged on January 19, 2006.  We have discussed the  possibility of discharge to short-term SNF and finally to home, but the  patient and spouse, emphatically declined this, in part due to financial  considerations and he insisted on being discharged home today.  He has  accepted PT/OT in the home environment and arrangements have been put in  place to provide him with a home health aide, RN as well as wound care.  We  have also contacted Dr. Theron Arista Rubin's office, and his scheduler, has agreed  to call patient on January 20, 2006, to schedule an outpatient appointment for  follow-up of his coagulopathy.   DIET:  Dysphagia III with nectar.   ACTIVITY:  As tolerated, otherwise by PT/OT.   WOUND CARE:  Daily dressings with Accuzyme and dry gauze, dressings with  Tegaderm, and also apply antifungal powder to focal areas.   FOLLOW-UP INSTRUCTIONS:  The patient has been instructed to follow up with  his primary MD, Dr. Feliciana Rossetti, within 1 week of discharge.  He is to  call for an appointment.  He is also to follow up with Dr. Pierce Crane,  hematology, per appointment which will be scheduled by Dr. Renelda Loma office,  which has undertaken to contact the patient on January 20, 2006.   SPECIAL INSTRUCTIONS:  Home health aide, home health RN/PT/OT and wound care  has been arranged.  All this has been communicated to the patient.  He has  verbalized understanding.      Isidor Holts, M.D.  Electronically Signed     CO/MEDQ  D:  01/19/2006  T:  01/19/2006  Job:  [id number]   cc:   Lacretia Leigh. Quintella Reichert, M.D.  [address]  [place] [address]   Pierce Crane, M.D.  Fax: [phone]  Lina Sar, M.D. [facility]  [address]  [place]  [place]  [address]

## 2010-12-26 NOTE — Consult Note (Signed)
NAMEHONG, MORING NO.:  [id number]   MEDICAL RECORD NO.:  [id number]          PATIENT TYPE:  INP   LOCATION:  2108                         FACILITY:  [facility]   PHYSICIAN:  Zola Button T. Lazarus Salines, M.D. DATE OF BIRTH:  1956-06-04   DATE OF CONSULTATION:  12/02/2005  DATE OF DISCHARGE:                                   CONSULTATION   CHIEF COMPLAINT:  Epistaxis.   HISTORY:  55 year old white male with alcohol induced medical problems  hospitalized for anemia felt to be related to chronic epistaxis.  He has  been bleeding for several days this time, and with multiple bleeding events  in the past.  According to Dr. Robb Matar, he has seen a previous ENT,  although neither she nor he is sure about exactly when or who.  No recent  injury to the nose.  No upper respiratory infection.  No prior surgery to  the nose.  According to Dr. Robb Matar, his platelet count is low at around  60,000, and his INR may be approaching 2, although neither one of these labs  is available in the chart at the present time.   PHYSICAL EXAMINATION:  This is a slightly distressed appearing middle-aged  white male who is mildly jaundiced.  Mental status seems basically intact.  He is not actively bleeding at the present time.  The head is atraumatic and  neck supple.  Cranial nerves grossly intact.  Both ear canals are clear.  He  had a small amount of blood behind the tympanic membrane on each side, but  otherwise normal appearing drums.  The anterior nose has clots in the right  side.  The buccal septum deviated towards the left.  Upon removing the  clots, there is some excoriation on the lower anterior septum on the right  side which is bleeding rather briskly in a venous fashion.  The oral cavity  is clear with teeth in fair to good repair and moist membranes.  No  telangiectasias.  Oropharynx is clear.  I did not examine the nasopharynx or  hypopharynx.  Neck without adenopathy.   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   PLAN:  With informed consent, I cleared out the nose and then anesthetized  it with 4% cocaine solution, 160 mg total, followed by 6 mL of 1% Xylocaine  with 1:100,000 epinephrine into the anterior septum on the right side.  After allowing several minutes for this to take effect, silver nitrate  cautery of a brisk bleeding vessel just behind the mucocutaneous junction on  the right anterior septum was performed with good tolerance.  Additional  cautery was performed in the septal buccal where there was synechial bands.  The mucosa in this area was rather soft, diffusely.  After achieving control  with silver nitrate cautery, the anterior nose was moderately firmly packed  with fibrillar Surgicel to keep the area moist and protected.   I will institute nasal hygiene measures.  This particular packing will  gently dissolve and evacuate without needing to  be removed so no additional  trauma to the nasal tissues is required.  I would avoid nose blowing and any  kind of  vigorous activities.  I would recommend an antibiotic anti-Staph but will  hold off and leave this decision to Dr. Robb Matar based on his low platelet  count and the possibility that the antibiotics will cause further  difficulty.  I will leave the ENT apparatus in the room overnight in case we  need to re-evaluate or retreat.      Gloris Manchester. Lazarus Salines, M.D.  Electronically Signed     KTW/MEDQ  D:  12/02/2005  T:  12/03/2005  Job:  [id number]   cc:   Miachel Roux L. Robb Matar, M.D.

## 2010-12-26 NOTE — Consult Note (Signed)
[name], BESWICK NO.:  [id number]   MEDICAL RECORD NO.:  [id number]          PATIENT TYPE:  INP   LOCATION:  2109                         FACILITY:  [facility]   PHYSICIAN:  Clois Comber. Margo Aye, MD       DATE OF BIRTH:  April 03, 1956   DATE OF CONSULTATION:  12/18/2005  DATE OF DISCHARGE:                                   CONSULTATION   TYPE OF CONSULTATION:  Palliative Medicine consultation.   HISTORY OF PRESENT ILLNESS:  Dr. Delford Field and Dr. Delton Coombes have requested  palliative medicine assist in determining goals of care for this patient.  The chart is reviewed, patient discussed extensively with Dr. Delton Coombes and the  health care team, patient briefly examined to determine the capacity for  involvement in decision making, prolonged discussion held for over 1 hour  with the patient's wife and sister.   IMPRESSION:  1.  Severely ill secondary to end stage liver disease, hepatic      encephalopathy, requirement for ventilator support, coagulopathy      secondary to liver disease, with underlying problems including diabetes      mellitus, hypertension, chronic smoking, chronic alcohol excess.  2.  Prognosis for survival of this hospitalization is poor.  3.  Family currently is in severe distress, with some differences of opinion      between the patient's wife and his sister.   RECOMMENDATIONS:  1.  Continued maximal treatment to sustain life.  2.  All team members should attempt to discuss details of the patient's case      with the patient's wife by phone, if necessary, rather than communicate      through the patient's sister, even though she is here much more than the      patient's wife is able to be here.  3.  Continued discussion with the family regarding plans for life support      will be needed, depending on how this case proceeds medically.   HISTORY OF PRESENT ILLNESS:  A 55 year old man who came into the hospital on  December 02, 2005 with evidence of hemorrhagic  shock and coagulopathy with a  significant history of recurrent nose bleeds as well as a right arm  hematoma. He was found to have a coagulopathy secondary to his liver  disease. This has been treated but he remains with significant concerns  regarding bleeding risk. His liver failure has been significant, with  hepatic encephalopathy. He subsequently had respiratory discomfort after  having had an evacuation of a hematoma from his right shoulder area. He has  been on a ventilator since that time. He has been sedated recently and it is  hard to know how severely the encephalopathy is affecting him.   In addition to the above, the patient has had distention of his abdomen,  with no evidence of significant ascites. This distention appearing to be  more gaseous collection in the colon and small intestine. He has had a CT  scan of the abdomen, which showed thickening of the mid right colon, which  could be an  area of diverticular inflammation or possibly a malignancy. This  has not been able to be further investigated because of the severity of  illness of the patient.   The patient has been continued on maximal support but continues to do quite  poorly. His sedation is being discontinued to see how much cognition is  present without the sedation. The patient's wife indicates that she is  looking into the possibility of liver transplant but the patient had  continued to drink up until only a week prior to this hospitalization, which  makes him transplant ineligible for at least 6 months.   PAST MEDICAL HISTORY:  Is as above plus diabetes mellitus and hypertension  diagnosed about 6 years ago. He is status post bilateral great toe  amputations because of foot ulcers. He smokes 2 packs a day for about 30  years and has drunk alcohol excessively from age 71 to the current  situation.   SOCIAL HISTORY:  The patient has been married to his current wife for 9  years. They run a business  together, [employer]. They have done this  for only about a year and the wife is trying to keep the business going  while the patient is in the hospital. The patient is one of 4 children with  his parents and 3 other children in the area. He completed 8th grade but  subsequently went on to get his GED. 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 He has no children. His sister, Babette Relic, is able to  spend substantial amounts of time with him here in the hospital, although  she works full time. The patient's wife is managing the business and is  unable to spend much time here in the hospital.   The patient considers himself a [place] but has not been a Equities trader. Was  raised in the [facility] environment. Wife says that the patient feels  that he will go to heaven when he dies.   FAMILY HISTORY:  Noncontributory except that there is alcohol difficulty  with patient's brother.   REVIEW OF SYSTEMS:  Not possible.   ALLERGIES:  NO KNOWN DRUG ALLERGIES.   MEDICATIONS:  1.  Atenolol 25 mg p.o. daily.  2.  Ferrous sulfate 325 mg p.o. t.i.d.  3.  Folic acid 1 mg IV daily.  4.  Furosemide 20 mg p.o. daily.  5.  Insulin Aspart coverage t.i.d.  6.  Lactulose 30 ml p.o. q.4 hours.  7.  Neomycin 1,000 mg by tube q.6 hours.  8.  Pantoprazole 40 mg IV daily.  9.  Vitamin K 10 mg IV daily.  10. Piperacillin tazobactam 3.375 grams IV q.6 hours.  11. Thiamine 100 mg IV daily.  12. Midazolam has just been discontinued.   PHYSICAL EXAMINATION:  GENERAL:  The examination is limited to the general  view of the patient, to determine his capacity to participate in a  discussion. He is lying in the bed on a ventilator with ET tube and gastric  tube in his mouth. The patient lies with his eyes closed and does not  respond at all to voice or tactile non-painful stimuli.  VITAL SIGNS:  Respiratory rate is rapid, as is his heart rate. ABDOMEN:  Distended and  tympanitic.   DISCUSSION WITH FAMILY:  The patient's wife, Bonita Quin, and sister, Tammy, Dr.  Delton Coombes, and I discussed the patient's medical situation and prognosis. Dr.  Delton Coombes explained the details of the various  problems and the potential  outcomes of these problems. After the patient's wife had asked and had  answered her questions, the patient's wife and sister and I sat and  discussed for a further hour and 10 minute, the various questions and  concerns they had. I obtained much more extensive social history regarding  the patient and the interactions with the family. We discussed the high  possibility that the patient's liver wound not recover to the point that he  would be able to survive and that it would take 6 months to make him  eligible for a liver transplant, after which it might be many months more  before he would be able to obtain a transplant. We discussed the importance  of the family working together and working with the health care team in  effective manner for the best outcomes for the patient. I gave them my card  so that they could contact me for further discussion of any questions that  come up.   DISCUSSION:  This patient's wife and sister are under tremendous strain and  emotional crisis because of difficulties outside the hospital as well as the  patient's current situation. The relative quickness of the severe problems  have not given the family much time to prepare for this degree of illness,  and they are having great difficulty coping. The patient's wife in  particular wants to be sure that all important information comes to her  first, if at all possible.   I believe that over time, this family will have more insight into the degree  of illness and will have a greater ability to come to grips with the overall  situation. In the meantime, continuing maximum support, as requested  fervently by the patient's wife, is appropriate.   Thank you for the opportunity  to participate in the care of this patient and  family. Palliative medicine will remain available to assist with further  discussions as requested by the family or the medical team.      Clois Comber. Margo Aye, MD  Electronically Signed     NKH/MEDQ  D:  12/18/2005  T:  12/19/2005  Job:  [id number]   cc:   Leslye Peer, M.D.   Shan Levans, M.D. [facility]  [address]  [place]  [place] [address]

## 2010-12-26 NOTE — Discharge Summary (Signed)
[name], Austin NO.:  [id number]   MEDICAL RECORD NO.:  [id number]          PATIENT TYPE:  INP   LOCATION:  5713                         FACILITY:  [facility]   PHYSICIAN:  Jonna L. Robb Matar, M.D.DATE OF BIRTH:  July 30, 1956   DATE OF ADMISSION:  12/02/2005  DATE OF DISCHARGE:  12/06/2005                                 DISCHARGE SUMMARY   INTERIM DISCHARGE SUMMARY   PRIMARY CARE PHYSICIAN:  Tinnie Gens C. Quintella Reichert, M.D.   CONSULTATIONS:  1.  Loreta Ave, M.D., orthopedics.  2.  Pierce Crane, M.D., hematologist.  3.  Gloris Manchester. Lazarus Salines, M.D., ENT.  4.  Antonietta Breach, M.D., psychiatry.   DISCHARGE DIAGNOSES:  1.  Hemorrhagic shock.  2.  Acquired coagulopathy.  3.  Thrombocytopenia.  4.  Alcoholic hepatitis.  5.  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  6.  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  7.  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  8.  Right mid colon lesion, consider diverticulitis versus cancer.  9.  Acute renal failure secondary to shock.   HISTORY OF PRESENT ILLNESS:  This 55 year old, Caucasian male came in with a  5-day history of nosebleed, but the history tends to go back 4 months where  he had intermittent epistaxis.  A week ago, he injured his right upper  extremity and then continued to be weak and fell injuring his left knee.  There is a past history of diabetes and hypertension.  He smokes a pack a  day and was drinking a fifth of alcohol plus two six packs of beer daily.  He had been on Benicar and metformin.  He was only on Lipitor x3 weeks and  this was discontinued.   PHYSICAL EXAMINATION:  VITAL SIGNS:  Blood pressure 70/42.  GENERAL:  Jaundiced male with over epistaxis.  HEART:  Tachycardia.  ABDOMEN:  Distended abdomen.  EXTREMITIES:  Large bruise over the right upper extremity.   LABORATORY DATA AND X-RAY FINDINGS:  Bilirubin 5.4 that went up to 8.9.  BUN  98, creatinine 4.5. Hemoglobin 5.1, platelet count 68.  INR 1.8.   HOSPITAL COURSE:  The patient was hydrated and received 10 units of red  cells, 4 units of fresh frozen plasma, several doses of Vitamin K and some  platelet transfusions as well.  His nose was packed and cauterized by ENT.  CAT scan showed hypertrophy of the liver, thickening in the right mid colon  that could be tumor infiltration or an inflammatory process, dilated left  retroperitoneal veins from varices and portal hypertension.  He developed  numbness and weakness of the right arm on April 28.  Consultation was  obtained with orthopedics and a MRI scan was done which showed an 18 x 6 x 6  cm, large hematoma in the axilla that was most likely was compressing the  brachial plexus and interfering with lymph circulation.  The patient is  going for evacuation of that hematoma on April 29.   DISPOSITION:  At present, the patient is getting thorough  hematology workup  and will have postop transfusion of platelets and possibly blood.  His INR  was down to 1.3 and platelet count was up slightly.  He will eventually  require some workup of the right colon.  His nose is still oozing and he  will continue to have postoperative follow up with ENT.  Long-term prognosis  is uncertain.      Jonna L. Robb Matar, M.D.  Electronically Signed     JLB/MEDQ  D:  12/06/2005  T:  12/07/2005  Job:  [id number]   cc:   Lacretia Leigh. Quintella Reichert, M.D.  [address]  [place] [address]   Loreta Ave, M.D.  Fax: [phone]   Pierce Crane, M.D.  Fax: [phone]   Gloris Manchester. Lazarus Salines, M.D.  Fax: [phone]   Antonietta Breach, M.D.

## 2011-02-05 LAB — HEMOGLOBIN A1C: Hgb A1c MFr Bld: 4.8 % (ref 4.0–6.0)

## 2011-02-05 LAB — HEPATIC FUNCTION PANEL: Bilirubin, Total: 2.2 mg/dL

## 2011-02-05 LAB — BASIC METABOLIC PANEL: Glucose: 128 mg/dL

## 2011-03-16 DIAGNOSIS — K7030 Alcoholic cirrhosis of liver without ascites: Secondary | ICD-10-CM | POA: Insufficient documentation

## 2011-09-23 ENCOUNTER — Encounter: Payer: Self-pay | Admitting: Family Medicine

## 2011-09-24 ENCOUNTER — Encounter: Payer: Self-pay | Admitting: Family Medicine

## 2011-09-24 ENCOUNTER — Ambulatory Visit (INDEPENDENT_AMBULATORY_CARE_PROVIDER_SITE_OTHER): Payer: Medicare Other | Admitting: Family Medicine

## 2011-09-24 VITALS — BP 120/71 | HR 57 | Temp 97.7°F | Resp 16 | Ht 73.0 in | Wt 235.0 lb

## 2011-09-24 DIAGNOSIS — K7460 Unspecified cirrhosis of liver: Secondary | ICD-10-CM | POA: Insufficient documentation

## 2011-09-24 DIAGNOSIS — I6350 Cerebral infarction due to unspecified occlusion or stenosis of unspecified cerebral artery: Secondary | ICD-10-CM

## 2011-09-24 DIAGNOSIS — S0101XA Laceration without foreign body of scalp, initial encounter: Secondary | ICD-10-CM | POA: Insufficient documentation

## 2011-09-24 DIAGNOSIS — I639 Cerebral infarction, unspecified: Secondary | ICD-10-CM

## 2011-09-24 DIAGNOSIS — I10 Essential (primary) hypertension: Secondary | ICD-10-CM | POA: Insufficient documentation

## 2011-09-24 DIAGNOSIS — E1165 Type 2 diabetes mellitus with hyperglycemia: Secondary | ICD-10-CM

## 2011-09-24 DIAGNOSIS — E119 Type 2 diabetes mellitus without complications: Secondary | ICD-10-CM

## 2011-09-24 LAB — CBC WITH DIFFERENTIAL/PLATELET
Basophils Absolute: 0 10*3/uL (ref 0.0–0.1)
Basophils Relative: 1 % (ref 0–1)
Eosinophils Absolute: 0.7 10*3/uL (ref 0.0–0.7)
Eosinophils Relative: 9 % — ABNORMAL HIGH (ref 0–5)
HCT: 38.7 % — ABNORMAL LOW (ref 39.0–52.0)
Hemoglobin: 13.2 g/dL (ref 13.0–17.0)
Lymphocytes Relative: 22 % (ref 12–46)
Lymphs Abs: 1.7 10*3/uL (ref 0.7–4.0)
MCH: 29.9 pg (ref 26.0–34.0)
MCHC: 34.1 g/dL (ref 30.0–36.0)
MCV: 87.6 fL (ref 78.0–100.0)
Monocytes Absolute: 0.6 10*3/uL (ref 0.1–1.0)
Monocytes Relative: 8 % (ref 3–12)
Neutro Abs: 4.6 10*3/uL (ref 1.7–7.7)
Neutrophils Relative %: 61 % (ref 43–77)
Platelets: 132 10*3/uL — ABNORMAL LOW (ref 150–400)
RBC: 4.42 MIL/uL (ref 4.22–5.81)
RDW: 14.6 % (ref 11.5–15.5)
WBC: 7.6 10*3/uL (ref 4.0–10.5)

## 2011-09-24 LAB — COMPREHENSIVE METABOLIC PANEL
ALT: 13 U/L (ref 0–53)
AST: 33 U/L (ref 0–37)
Albumin: 3.8 g/dL (ref 3.5–5.2)
Alkaline Phosphatase: 64 U/L (ref 39–117)
BUN: 8 mg/dL (ref 6–23)
CO2: 23 mEq/L (ref 19–32)
Calcium: 8.9 mg/dL (ref 8.4–10.5)
Chloride: 105 mEq/L (ref 96–112)
Creat: 0.73 mg/dL (ref 0.50–1.35)
Glucose, Bld: 105 mg/dL — ABNORMAL HIGH (ref 70–99)
Potassium: 4.3 mEq/L (ref 3.5–5.3)
Sodium: 137 mEq/L (ref 135–145)
Total Bilirubin: 2.2 mg/dL — ABNORMAL HIGH (ref 0.3–1.2)
Total Protein: 7 g/dL (ref 6.0–8.3)

## 2011-09-24 LAB — POCT GLYCOSYLATED HEMOGLOBIN (HGB A1C): Hemoglobin A1C: 5

## 2011-09-24 LAB — GLUCOSE, POCT (MANUAL RESULT ENTRY): POC Glucose: 117

## 2011-09-24 NOTE — Progress Notes (Signed)
56 yo disabled man with cirrhosis and chronic pain.  Goes to pain management for antidepressant, valium and oxycodone.  Needs clearance for anticipated amputation of last three toes on left foot. (All other toes have been amputated.)  Occasional smoker, using primarily electronic cigarettes. Pain controlled, mainly in feet at night.  Disabled from right hemiparesis  Wife Bonita Quin has severe back pain  O: NAD  Alert, cooperative HEENT:  False teeth upper and lower Lungs cleared Heart reg Abd no HSM, nontender, mod obesity Ext: small area of gangrene left third toe.  Good pulses no edema.  Callus on sole of left foot. Results for orders placed in visit on 09/24/11  POCT GLYCOSYLATED HEMOGLOBIN (HGB A1C)      Component Value Range   Hemoglobin A1C 5.0    GLUCOSE, POCT (MANUAL RESULT ENTRY)      Component Value Range   POC Glucose 117     A:  Patient is able to tolerate simple amputation of lateral three left toes.  Given his medication polypharmacy, the liver function and liver function still needs to be checked  P: CMET and CBC pending Need to write letter to Dr. Sherlynn Carbon once labs are back.

## 2011-09-28 ENCOUNTER — Telehealth: Payer: Self-pay

## 2011-09-28 NOTE — Telephone Encounter (Signed)
I faxed over the requested information

## 2011-09-28 NOTE — Telephone Encounter (Signed)
.  [facility] JODIE FROM [facility] STATES THEY NEED LABS FAXED ASAP AND OV NOTES ON PT. HE IS IN THE OFFICE NOW PLEASE FAX TO [phone] AND THE PHONE NUMBER IS [phone]

## 2011-11-11 ENCOUNTER — Telehealth: Payer: Self-pay

## 2011-11-11 NOTE — Telephone Encounter (Signed)
PT DROPPED OFF FORM @104  TO BE COMPLETED FOR DIABETIC SHOES. PLEASE COMPLETE AND FAX FORM TO [phone]  THE FORM IS @ 104

## 2011-11-12 ENCOUNTER — Telehealth: Payer: Self-pay

## 2011-11-12 NOTE — Telephone Encounter (Signed)
PT STATES WE ARE SUPPOSE TO BE GETTING A FORM FROM [facility] TO BE SIGNED SO HE CAN GET HIS DIABETIC SHOES. PLEASE CALL PT AT [phone]

## 2011-11-12 NOTE — Telephone Encounter (Signed)
MR-- do you see this in patients chart?  If not, please pull and route to Clinical.

## 2011-11-13 NOTE — Telephone Encounter (Signed)
Dr L, pts chart with form on front is in your box. I have filled in what I can.

## 2011-11-13 NOTE — Telephone Encounter (Signed)
Patient called to check on status of form for him to get shoes. Please call when ready.

## 2011-11-13 NOTE — Telephone Encounter (Signed)
No form in chart. Chart pulled to clinical.

## 2011-11-13 NOTE — Telephone Encounter (Signed)
Form completed, in doctor's lounge

## 2011-11-13 NOTE — Telephone Encounter (Signed)
I signed the request and the chart is in the doctor's lounge to be faxed.

## 2011-11-14 NOTE — Telephone Encounter (Signed)
Form faxed

## 2011-11-14 NOTE — Telephone Encounter (Signed)
Form faxed and patients wife notified.

## 2011-11-17 ENCOUNTER — Encounter: Payer: Self-pay | Admitting: Family Medicine

## 2011-11-26 ENCOUNTER — Ambulatory Visit: Payer: Medicare HMO

## 2011-11-26 ENCOUNTER — Ambulatory Visit (INDEPENDENT_AMBULATORY_CARE_PROVIDER_SITE_OTHER): Payer: Medicare HMO | Admitting: Family Medicine

## 2011-11-26 VITALS — BP 103/67 | HR 65 | Temp 97.5°F | Resp 18 | Ht 71.5 in | Wt 250.2 lb

## 2011-11-26 DIAGNOSIS — E119 Type 2 diabetes mellitus without complications: Secondary | ICD-10-CM

## 2011-11-26 DIAGNOSIS — M25511 Pain in right shoulder: Secondary | ICD-10-CM

## 2011-11-26 DIAGNOSIS — M25519 Pain in unspecified shoulder: Secondary | ICD-10-CM

## 2011-11-26 NOTE — Progress Notes (Signed)
  Subjective:    Patient ID: [name], male    DOB: 1956/05/09, 56 y.o.   MRN: [id number]  HPI Patient presents with complex past medical history after falling on his (R) shoulder yesterday.  Patient has difficulty with balance and proprioception secondary to diabetic neuropathy and toe amputations. Today patient states he is having a difficult time abducting (R) shoulder. Requesting cortisone injection  Type 2 DM with end organ damage- excellent BS control.  Fasting BS today 104  Review of Systems     Objective:   Physical Exam  Constitutional: He appears well-developed.  Cardiovascular: Normal rate, regular rhythm and normal heart sounds.   Pulmonary/Chest: Effort normal and breath sounds normal.  Neurological: He is alert.     [facility] reading (PRIMARY) by  Dr. Hal Hope (R) shoulder film without fracture or dislocation  After informed consent Patients (R) shoulder cleansed in sterile manner DM 80mg  and 3 cc of 2% lidocaine injected in sterile manner Patient tolerated procedure without complications       Assessment & Plan:   1. Shoulder pain, right; traumatic rotator cuff tendinitis  DG Shoulder Right  2. DM type 2 (diabetes mellitus, type 2)  Monitor BS over the next week given cortisone injection   Anticipatory guidance

## 2012-03-10 ENCOUNTER — Telehealth: Payer: Self-pay

## 2012-03-10 ENCOUNTER — Ambulatory Visit (INDEPENDENT_AMBULATORY_CARE_PROVIDER_SITE_OTHER): Payer: Medicare HMO | Admitting: Emergency Medicine

## 2012-03-10 VITALS — BP 124/80 | HR 64 | Temp 98.0°F | Resp 16 | Ht 70.5 in | Wt 253.0 lb

## 2012-03-10 DIAGNOSIS — S91009A Unspecified open wound, unspecified ankle, initial encounter: Secondary | ICD-10-CM

## 2012-03-10 DIAGNOSIS — S81819A Laceration without foreign body, unspecified lower leg, initial encounter: Secondary | ICD-10-CM

## 2012-03-10 MED ORDER — CLINDAMYCIN HCL 300 MG PO CAPS: 300.0000 mg | ORAL_CAPSULE | Freq: Three times a day (TID) | ORAL | Status: AC

## 2012-03-10 NOTE — Telephone Encounter (Signed)
I have advised wife, he needs to be seen for this. The dog was not known to him and it is safer for him to come in to be seen.

## 2012-03-10 NOTE — Patient Instructions (Addendum)
Animal Bite  An animal bite can result in a scratch on the skin, deep open cut, puncture of the skin, crush injury, or tearing away of the skin or a body part. Dogs are responsible for most animal bites. Children are bitten more often than adults. An animal bite can range from very mild to more serious. A small bite from your house pet is no cause for alarm. However, some animal bites can become infected or injure a bone or other tissue. You must seek medical care if:  · The skin is broken and bleeding does not slow down or stop after 15 minutes.  · The puncture is deep and difficult to clean (such as a cat bite).  · Pain, warmth, redness, or pus develops around the wound.  · The bite is from a stray animal or rodent. There may be a risk of rabies infection.  · The bite is from a snake, raccoon, skunk, fox, coyote, or bat. There may be a risk of rabies infection.  · The person bitten has a chronic illness such as diabetes, liver disease, or cancer, or the person takes medicine that lowers the immune system.  · There is concern about the location and severity of the bite.  It is important to clean and protect an animal bite wound right away to prevent infection. Follow these steps:  · Clean the wound with plenty of water and soap.  · Apply an antibiotic cream.  · Apply gentle pressure over the wound with a clean towel or gauze to slow or stop bleeding.  · Elevate the affected area above the heart to help stop any bleeding.  · Seek medical care. Getting medical care within 8 hours of the animal bite leads to the best possible outcome.  DIAGNOSIS   Your caregiver will most likely:  · Take a detailed history of the animal and the bite injury.  · Perform a wound exam.  · Take your medical history.  Blood tests or X-rays may be performed. Sometimes, infected bite wounds are cultured and sent to a lab to identify the infectious bacteria.   TREATMENT   Medical treatment will depend on the location and type of animal bite as  well as the patient's medical history. Treatment may include:  · Wound care, such as cleaning and flushing the wound with saline solution, bandaging, and elevating the affected area.  · Antibiotics.  · Tetanus immunization.  · Rabies immunization.  · Leaving the wound open to heal. This is often done with animal bites, due to the high risk of infection. However, in certain cases, wound closure with stitches, wound adhesive, skin adhesive strips, or staples may be used.   Infected bites that are left untreated may require intravenous (IV) antibiotics and surgical treatment in the hospital.  HOME CARE INSTRUCTIONS  · Follow your caregiver's instructions for wound care.  · Take all medicines as directed.  · If your caregiver prescribes antibiotics, take them as directed. Finish them even if you start to feel better.  · Follow up with your caregiver for further exams or immunizations as directed.  You may need a tetanus shot if:  · You cannot remember when you had your last tetanus shot.  · You have never had a tetanus shot.  · The injury broke your skin.  If you get a tetanus shot, your arm may swell, get red, and feel warm to the touch. This is common and not a problem. If you need a tetanus   shot and you choose not to have one, there is a rare chance of getting tetanus. Sickness from tetanus can be serious.  SEEK MEDICAL CARE IF:  · You notice warmth, redness, soreness, swelling, pus discharge, or a bad smell coming from the wound.  · You have a red line on the skin coming from the wound.  · You have a fever, chills, or a general ill feeling.  · You have nausea or vomiting.  · You have continued or worsening pain.  · You have trouble moving the injured part.  · You have other questions or concerns.  MAKE SURE YOU:  · Understand these instructions.  · Will watch your condition.  · Will get help right away if you are not doing well or get worse.  Document Released: 04/14/2011 Document Revised: 07/16/2011 Document  Reviewed: 04/14/2011  Exit[employer]® Patient Information ©2012 [employer].

## 2012-03-10 NOTE — Progress Notes (Signed)
   Date:  03/10/2012   Name:  [name]   DOB:  [birth year]   MRN:  [id number]  PCP:  No primary provider on file.    Chief Complaint: Animal Bite   History of Present Illness:  [name] is a 56 y.o. very pleasant male patient who presents with the following:  Diabetic with amputations all 10 toes.  Bitten by a stray dog last night and was attended by EMS who apparently advised them not to do anything for 10 days.  They decided to come and have the wounds treated.  Current on Tetanus  Patient Active Problem List  Diagnosis  . Diabetes type 2, uncontrolled  . Hypertension  . Cirrhosis  . CVA (cerebral infarction)    No past medical history on file.  No past surgical history on file.  History  Substance Use Topics  . Smoking status: Current Everyday Smoker    Last Attempt to Quit: 03/24/2011  . Smokeless tobacco: Not on file  . Alcohol Use: Not on file    No family history on file.  Allergies  Allergen Reactions  . Cephalexin     Medication list has been reviewed and updated.  Current Outpatient Prescriptions on File Prior to Visit  Medication Sig Dispense Refill  . diazepam (VALIUM) 5 MG tablet Take 5 mg by mouth 3 (three) times daily as needed.      . Multiple Vitamin (MULTIVITAMIN) tablet Take 1 tablet by mouth daily.      . nadolol (CORGARD) 20 MG tablet Take 20 mg by mouth daily.      Marland Kitchen oxyCODONE (ROXICODONE) 15 MG immediate release tablet Take 15 mg by mouth 4 (four) times daily.      Marland Kitchen PARoxetine (PAXIL) 10 MG tablet Take 10 mg by mouth daily.      Marland Kitchen spironolactone (ALDACTONE) 25 MG tablet Take 25 mg by mouth 2 (two) times daily.      Marland Kitchen thiamine (VITAMIN B-1) 100 MG tablet Take 100 mg by mouth daily.        Review of Systems:  As per HPI, otherwise negative.    Physical Examination: Filed Vitals:   03/10/12 1856  BP: 124/80  Pulse: 64  Temp: 98 F (36.7 C)  Resp: 16   Filed Vitals:   03/10/12 1856  Height: 5' 10.5" (1.791 m)  Weight:  253 lb (114.76 kg)   Body mass index is 35.79 kg/(m^2). Ideal Body Weight: Weight in (lb) to have BMI = 25: 176.4    GEN: WDWN, NAD, Non-toxic, Alert & Oriented x 3 HEENT: Atraumatic, Normocephalic.  Ears and Nose: No external deformity. EXTR: No clubbing/cyanosis/edema NEURO: Normal gait.  PSYCH: Normally interactive. Conversant. Not depressed or anxious appearing.  Calm demeanor.  Leg;  Multiple superficial lacerations left distal lower leg.  NATI no FB.  No obvious infection  Assessment and Plan: Dog bite Animal control on the case Discussed at length need for vigilance about infection and need to remain informed about rabies and the 10 day post wound window for receipt of immunization series. Follow up as needed   Carmelina Dane, MD

## 2012-03-10 NOTE — Telephone Encounter (Signed)
[name] STATES HER HUSBAND HAD GOTTEN BITE BY A DOG AND WAS SEEN BY THE EMS. I ASKED HAD HE HAD A TETANUS SHOT SINCE IT BROKE THE SKIN FROM FRONT TO BACK AND SHE WAS SURE HE HAS HAD ONE. WOULD LIKE TO KNOW IF HE CAN GET SOME ANTIBIOTICS CALLED IN. PLEASE CALL [phone]    [facility] ON [address]

## 2012-03-13 ENCOUNTER — Telehealth: Payer: Self-pay

## 2012-03-13 NOTE — Telephone Encounter (Signed)
Spoke with pt advised message from [name]. Pt understood.

## 2012-03-13 NOTE — Telephone Encounter (Signed)
Patient states he was seen Thursday for a dog bite. The dog and the pups are now gone and he wants to know if he needs to get a rabies shot and how many shots are in the series. Also will his [facility] and social security cover the rabies shot. Please call patient back on wife [name] cell at [phone] and its ok to leave a message if she doesn't answer.

## 2012-03-13 NOTE — Telephone Encounter (Signed)
I am unsure how much they cost - but pt has to go to the ED for them and he will have to call his insurance to see how they are covered.

## 2012-03-13 NOTE — Telephone Encounter (Signed)
Called pt to give info, LMOM to CB.

## 2012-03-28 ENCOUNTER — Ambulatory Visit (INDEPENDENT_AMBULATORY_CARE_PROVIDER_SITE_OTHER): Payer: Medicare HMO | Admitting: Family Medicine

## 2012-03-28 VITALS — BP 138/87 | HR 81 | Temp 98.7°F | Resp 22

## 2012-03-28 DIAGNOSIS — E119 Type 2 diabetes mellitus without complications: Secondary | ICD-10-CM

## 2012-03-28 DIAGNOSIS — S81009A Unspecified open wound, unspecified knee, initial encounter: Secondary | ICD-10-CM

## 2012-03-28 DIAGNOSIS — M79609 Pain in unspecified limb: Secondary | ICD-10-CM

## 2012-03-28 DIAGNOSIS — M79669 Pain in unspecified lower leg: Secondary | ICD-10-CM

## 2012-03-28 DIAGNOSIS — S81809A Unspecified open wound, unspecified lower leg, initial encounter: Secondary | ICD-10-CM

## 2012-03-28 NOTE — Progress Notes (Signed)
   Patient ID: [name] MRN: [id number], DOB: 06-Aug-1956, 56 y.o. Date of Encounter: 03/28/2012, 6:44 PM   PROCEDURE NOTE: Verbal consent obtained. Sterile technique employed. Numbing: Anesthesia obtained with 1% lidocaine with epi 3 cc for local.  Cleansed with soap and water. Irrigated. Betadine prep per usual protocol.  Wound explored, no deep structures involved, no foreign bodies.   Wound repaired with # 11 simple interrupted sutures of 5-0 Prolene. Hemostasis obtained. Wound cleansed and dressed.  Wound care instructions including precautions covered with patient. Handout given.  Anticipate suture removal in 14 days.  SignedEula Listen, PA-C 03/28/2012 6:44 PM

## 2012-03-28 NOTE — Progress Notes (Signed)
Patient was breaking up some old glass from a storm door. A piece cut them in the ankle. He recently had a tetanus shot.  Objective: 3.7 cm wounds of ankle. It looks clean. No vital structures seem to be involved. 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 However sensory seem fair.  Assessment: Wound ankle  Plan: Sutures Return in 2 weeks for sutures to be removed.

## 2012-03-28 NOTE — Patient Instructions (Signed)
Return in 10-14 days for suture removal. Sooner if any evidence of infection

## 2012-04-12 ENCOUNTER — Ambulatory Visit (INDEPENDENT_AMBULATORY_CARE_PROVIDER_SITE_OTHER): Payer: Medicare HMO | Admitting: Physician Assistant

## 2012-04-12 VITALS — BP 94/62 | HR 54 | Temp 97.5°F | Resp 12 | Ht 72.5 in | Wt 243.6 lb

## 2012-04-12 DIAGNOSIS — S81009A Unspecified open wound, unspecified knee, initial encounter: Secondary | ICD-10-CM

## 2012-04-12 DIAGNOSIS — S91009A Unspecified open wound, unspecified ankle, initial encounter: Secondary | ICD-10-CM

## 2012-04-12 DIAGNOSIS — S81801A Unspecified open wound, right lower leg, initial encounter: Secondary | ICD-10-CM

## 2012-04-12 NOTE — Progress Notes (Signed)
  Subjective:    Patient ID: [name], male    DOB: 12/28/1955, 56 y.o.   MRN: [id number]  HPI  Pt presents for suture removal.  No problems with the area.   Review of Systems  Constitutional: Negative for fever and chills.  Skin: Positive for wound.       Objective:   Physical Exam  Vitals reviewed. Constitutional: He is oriented to person, place, and time. He appears well-developed and well-nourished.  HENT:  Head: Normocephalic and atraumatic.  Right Ear: External ear normal.  Left Ear: External ear normal.  Pulmonary/Chest: Effort normal.  Neurological: He is alert and oriented to person, place, and time.  Skin: Skin is warm and dry.       Well healed wound.  Sutures removed without difficulty.  Psychiatric: He has a normal mood and affect. His behavior is normal. Judgment and thought content normal.          Assessment & Plan:  Wound R lower leg - well healed and stitches removed.

## 2012-04-28 ENCOUNTER — Telehealth: Payer: Self-pay

## 2012-04-28 NOTE — Telephone Encounter (Signed)
Pt would like Dr. Elbert Ewings to write him a written excuse from jury duty due to health reasons and the effect his pain medicines have on him. [phone]

## 2012-04-28 NOTE — Telephone Encounter (Signed)
Okay to provide excuse from jury because of severe medical problems and chronic pain medication

## 2012-04-29 NOTE — Telephone Encounter (Signed)
Letter written/printed and pt notified ready for p/up.

## 2012-06-07 ENCOUNTER — Ambulatory Visit (INDEPENDENT_AMBULATORY_CARE_PROVIDER_SITE_OTHER): Payer: Medicare HMO | Admitting: Family Medicine

## 2012-06-07 VITALS — BP 110/72 | HR 62 | Temp 97.3°F | Resp 16 | Ht 71.0 in | Wt 260.0 lb

## 2012-06-07 DIAGNOSIS — E1159 Type 2 diabetes mellitus with other circulatory complications: Secondary | ICD-10-CM

## 2012-06-07 DIAGNOSIS — I70209 Unspecified atherosclerosis of native arteries of extremities, unspecified extremity: Secondary | ICD-10-CM

## 2012-06-07 DIAGNOSIS — E1151 Type 2 diabetes mellitus with diabetic peripheral angiopathy without gangrene: Secondary | ICD-10-CM

## 2012-06-07 DIAGNOSIS — Z23 Encounter for immunization: Secondary | ICD-10-CM

## 2012-06-07 MED ORDER — GLUCOSE BLOOD VI STRP: ORAL_STRIP | Status: DC

## 2012-06-07 MED ORDER — INFLUENZA VIRUS VACC SPLIT PF IM SUSP: 0.5000 mL | INTRAMUSCULAR | Status: DC

## 2012-06-07 NOTE — Progress Notes (Signed)
56 yo with cirrhosis and diabetes, needs clearance for surgery for foot ulcer.  Intermittently confused according to wife. Also needs flu shot and diabetes test strips.  Up to date on tetanus.  Objective:  Slurred speech, NAD.  Seen with wife Linda. Chest:  Clear Heart:  Regular without murmur Abdomen: soft, nontender with no HSM.  Obesity limits exam Extrem:  Superficial abrasion left great toe stub  Assessment:  Should be able to tolerate local anesthetics without problem  Plan:  Hold the amitriptylline until next pain visit Obtain records from [place] Flu shot today Clear for local anesth foot debridement. [facility]:  Dr. Vogler 

## 2012-06-07 NOTE — Patient Instructions (Addendum)
56 yo with cirrhosis and diabetes, needs clearance for surgery for foot ulcer.  Intermittently confused according to wife. Also needs flu shot and diabetes test strips.  Up to date on tetanus.  Objective:  Slurred speech, NAD.  Seen with wife Bonita Quin. Chest:  Clear Heart:  Regular without murmur Abdomen: soft, nontender with no HSM.  Obesity limits exam Extrem:  Superficial abrasion left great toe stub  Assessment:  Should be able to tolerate local anesthetics without problem  Plan:  Hold the amitriptylline until next pain visit Obtain records from [facility] Flu shot today Clear for local anesth foot debridement. [facility]:  Dr. Bennett Scrape

## 2012-06-15 ENCOUNTER — Other Ambulatory Visit: Payer: Self-pay | Admitting: Radiology

## 2012-06-15 DIAGNOSIS — E1151 Type 2 diabetes mellitus with diabetic peripheral angiopathy without gangrene: Secondary | ICD-10-CM

## 2012-06-15 MED ORDER — GLUCOSE BLOOD VI STRP: ORAL_STRIP | Status: DC

## 2012-06-16 ENCOUNTER — Ambulatory Visit: Payer: Medicare HMO | Admitting: Family Medicine

## 2012-07-21 ENCOUNTER — Ambulatory Visit (INDEPENDENT_AMBULATORY_CARE_PROVIDER_SITE_OTHER): Payer: Medicare HMO | Admitting: Family Medicine

## 2012-07-21 VITALS — BP 115/75 | HR 71 | Temp 97.5°F | Resp 16

## 2012-07-21 DIAGNOSIS — Z4889 Encounter for other specified surgical aftercare: Secondary | ICD-10-CM

## 2012-07-21 DIAGNOSIS — S98919A Complete traumatic amputation of unspecified foot, level unspecified, initial encounter: Secondary | ICD-10-CM

## 2012-07-21 DIAGNOSIS — M79606 Pain in leg, unspecified: Secondary | ICD-10-CM

## 2012-07-21 DIAGNOSIS — M79609 Pain in unspecified limb: Secondary | ICD-10-CM

## 2012-07-21 DIAGNOSIS — Z89439 Acquired absence of unspecified foot: Secondary | ICD-10-CM

## 2012-07-21 MED ORDER — KETOROLAC TROMETHAMINE 30 MG/ML IJ SOLN
30.0000 mg | Freq: Once | INTRAMUSCULAR | Status: AC
  Administered 2012-07-21: 30 mg via INTRAMUSCULAR

## 2012-07-21 NOTE — Progress Notes (Signed)
[facility]:  Office Visit  Chief Complaint:  Chief Complaint  Patient presents with  . Wound Check    left foot  [facility] wrapped too tight, didn't come today    HPI: [name] is a 56 y.o. male who complains of wound care needed for left foot stump s/p toe amputation. He is not coherent since he is on oxycodone and valium. Most of the history is from his wife who takes care of him. [facility] is uspposed to come every day per the patietn and wife to change his dressings and do wound care. They stated they would teach his wife how to do it but she sated she does not or is able to do that type of wound care, makes her ill. [facility] did not come today to do the dressing change which needs to be done daily per MD rx.  So he is here for this. He denies any fevers, shills, purulent drainage from wound. He c/o pain due to tightness of dressing.   Past Medical History  Diagnosis Date  . Cirrhosis   . Depression   . Diabetes mellitus without complication   . Foot pain   . Anxiety   . Hypertension    No past surgical history on file. History   Social History  . Marital Status: Single    Spouse Name: N/A    Number of Children: N/A  . Years of Education: N/A   Social History Main Topics  . Smoking status: Current Every Day Smoker -- 0.3 packs/day    Types: Cigarettes    Last Attempt to Quit: 03/24/2011  . Smokeless tobacco: None  . Alcohol Use: None  . Drug Use: None  . Sexually Active: None   Other Topics Concern  . None   Social History Narrative  . None   No family history on file. Allergies  Allergen Reactions  . Cephalexin    Prior to Admission medications   Medication Sig Start Date End Date Taking? Authorizing Provider  amitriptyline (ELAVIL) 50 MG tablet Take 50 mg by mouth at bedtime.   Yes Historical Provider, MD  diazepam (VALIUM) 5 MG tablet Take 5 mg by mouth 3 (three) times daily as needed.   Yes Historical  Provider, MD  glucose blood (CHOICE DM FORA G20 TEST STRIPS) test strip To check blood sugar three times daily. Freestyle test strips/ dx code 250.00 06/15/12  Yes Elvina Sidle, MD  Multiple Vitamin (MULTIVITAMIN) tablet Take 1 tablet by mouth daily.   Yes Historical Provider, MD  nadolol (CORGARD) 20 MG tablet Take 20 mg by mouth daily.   Yes Historical Provider, MD  oxyCODONE (ROXICODONE) 15 MG immediate release tablet Take 15 mg by mouth 4 (four) times daily.   Yes Historical Provider, MD  PARoxetine (PAXIL) 10 MG tablet Take 10 mg by mouth daily.   Yes Historical Provider, MD  spironolactone (ALDACTONE) 25 MG tablet Take 25 mg by mouth 2 (two) times daily.   Yes Historical Provider, MD  sulfamethoxazole-trimethoprim (BACTRIM DS) 800-160 MG per tablet Take 1 tablet by mouth 2 (two) times daily.   Yes Historical Provider, MD  thiamine (VITAMIN B-1) 100 MG tablet Take 100 mg by mouth daily.   Yes Historical Provider, MD     ROS: The patient denies fevers, chills, night sweats, unintentional weight loss, chest pain, palpitations, wheezing, dyspnea on exertion, nausea, vomiting, abdominal pain, dysuria, hematuria, melena  All other systems have  been reviewed and were otherwise negative with the exception of those mentioned in the HPI and as above.    PHYSICAL EXAM: Filed Vitals:   07/21/12 1944  BP: 115/75  Pulse: 71  Temp: 97.5 F (36.4 C)  Resp: 16   There were no vitals filed for this visit. There is no height or weight on file to calculate BMI.  General: Alert, no acute distress HEENT:  Normocephalic, atraumatic, oropharynx patent.  Cardiovascular:  Regular rate and rhythm.  No pedal edema.  Respiratory: Clear to auscultation bilaterally.  No wheezes, rales, or rhonchi.  No cyanosis, no use of accessory musculature GI: No organomegaly, abdomen is soft and non-tender, positive bowel sounds.  No masses. Skin: No rashes. Neurologic: Facial musculature symmetric. Psychiatric:  Patient is appropriate throughout our interaction. Lymphatic: No cervical lymphadenopathy Musculoskeletal: Left foot in boot, uses walker   LABS: Results for orders placed in visit on 03/28/12  GLUCOSE, POCT (MANUAL RESULT ENTRY)      Component Value Range   POC Glucose 112 (*) 70 - 99 mg/dl     EKG/XRAY:   Primary read interpreted by Dr. Conley Rolls at [facility].   ASSESSMENT/PLAN: Encounter Diagnoses  Name Primary?  . Leg pain Yes  . H/O amputation of foot   . Encounter for postoperative wound care    Toradol 30 mg IM x 1 given in office Wound care administered per MD instructions: clean with normal saline, application of betadine, Kerlex dressing, ACE wrap over splint. F/u with surgeon prn, call [facility] for wound care needs     Murle Hellstrom PHUONG, DO 07/22/2012 12:45 PM

## 2012-07-23 ENCOUNTER — Ambulatory Visit (INDEPENDENT_AMBULATORY_CARE_PROVIDER_SITE_OTHER): Payer: Medicare HMO | Admitting: Internal Medicine

## 2012-07-23 ENCOUNTER — Encounter: Payer: Self-pay | Admitting: Internal Medicine

## 2012-07-23 VITALS — BP 106/68 | HR 64 | Temp 98.2°F | Resp 20 | Ht 73.0 in | Wt 257.0 lb

## 2012-07-23 DIAGNOSIS — E1165 Type 2 diabetes mellitus with hyperglycemia: Secondary | ICD-10-CM

## 2012-07-23 DIAGNOSIS — S91309A Unspecified open wound, unspecified foot, initial encounter: Secondary | ICD-10-CM

## 2012-07-23 NOTE — Progress Notes (Signed)
  Subjective:    Patient ID: [name], male    DOB: 1955-08-26, 56 y.o.   MRN: [id number]  HPI all up for wound check and redressing Past serious peripheral arterial disease associated with diabetes Now almost one month status post surgeries x3 which has resulted in amputated big toe Over last 48 hours has not had new pain and has had no fever Whether [facility] will be involved is still a pending issue Has followup care with his surgeon on Monday Remains on antibiotics   Review of Systems     Objective:   Physical Exam Vital signs stable Wound appears clean with minimal purulence and with no associated cellulitis Suture line is intact with multiple clips Amputation site has a small opening that is healing by secondary intention       Assessment & Plan:  wound foot status post surgery  Wound cleaned with saline dressing/cleaned also with Betadine and  redressed with kling and Ace wrap over splint F/u with surgery Monday

## 2012-09-19 ENCOUNTER — Telehealth: Payer: Self-pay

## 2012-09-19 NOTE — Telephone Encounter (Signed)
Pt's wife brought by PPW concerning denials of claims for specialists appts pt has had. Ins is requiring PCP, Dr Milus Glazier, to fax in referrals for these services to the # provided on PPW. Can we do this? I have put the PPW in Angela's box who is filling in for Dr L in his absence. Marylene Land, please forward PPW and message to Dr L if he needs to address this.

## 2012-09-26 NOTE — Telephone Encounter (Signed)
Called pt's wife to clarify what each of the three specialists' visits were for in order to have info to write referrals. I have written on the [facility] forms what conditions the pt was seen for and put it back in Angela's box for further review.  Bonita Quin would like [place] to notify her after the referrals have been faxed to the number on PPW. Requests that we leave a detailed message if she can not answer the phone.

## 2012-10-13 NOTE — Telephone Encounter (Signed)
I spoke w/Angela concerning status of these retro referrals and she placed them in Dr L's box for review when he returns.

## 2012-10-17 NOTE — Telephone Encounter (Signed)
In Dr. L's box. 

## 2012-10-19 ENCOUNTER — Other Ambulatory Visit: Payer: Self-pay | Admitting: Family Medicine

## 2012-10-19 DIAGNOSIS — K7460 Unspecified cirrhosis of liver: Secondary | ICD-10-CM

## 2012-10-19 DIAGNOSIS — S88119A Complete traumatic amputation at level between knee and ankle, unspecified lower leg, initial encounter: Secondary | ICD-10-CM

## 2012-10-19 DIAGNOSIS — G894 Chronic pain syndrome: Secondary | ICD-10-CM

## 2012-10-25 NOTE — Telephone Encounter (Signed)
Checked w/Referrals and they have put all three of the referrals in. Contacted pt and LMOM to notify and ask if anything else is needed from [place]. Advised that they might want to check w/specialist's offices and see if something needs to be run throught ins again now, and to CB if anything else is needed.

## 2012-10-26 ENCOUNTER — Encounter: Payer: Self-pay | Admitting: Family Medicine

## 2012-10-26 ENCOUNTER — Telehealth: Payer: Self-pay

## 2012-10-26 NOTE — Telephone Encounter (Signed)
Faxed letter w/cover sheet w/pt's [employer] ID and all three claim #s. Mailed pt copy of letter and confirmation fax went through.

## 2012-10-26 NOTE — Telephone Encounter (Signed)
Pt's wife CB and stated that the forms she brought in for pt's insurance concerning 3 specialists visits had the directions on them w/fax number they needed to be faxed to. He does not need current/new referrals. The insurance needs statements from the PCP that the specialists were seen prior to 2014 and that the PCP previously approved pt's referrals to these specialists. The first specialist was Rickard Patience in [place] who is the liver specialist pt sees, claim # [id number]. The second specialist was Dr Kirby Crigler who is the pain specialist pt sees for his amputation, claim # [id number]. The third specialist is to [facility] and was for pt's surgery/amputation, claim # [id number].  Dr Elbert Ewings, can we get these statements/approvals for former referrals written and faxed to [facility] at fax # [phone]?  If you have any further ?s, you may call pt's wife and she still has copies of the forms.

## 2013-01-15 ENCOUNTER — Ambulatory Visit (INDEPENDENT_AMBULATORY_CARE_PROVIDER_SITE_OTHER): Payer: Medicare HMO | Admitting: Family Medicine

## 2013-01-15 VITALS — BP 122/71 | HR 62 | Temp 98.3°F | Resp 17 | Ht 73.0 in | Wt 262.0 lb

## 2013-01-15 DIAGNOSIS — L97511 Non-pressure chronic ulcer of other part of right foot limited to breakdown of skin: Secondary | ICD-10-CM

## 2013-01-15 DIAGNOSIS — L97509 Non-pressure chronic ulcer of other part of unspecified foot with unspecified severity: Secondary | ICD-10-CM

## 2013-01-15 DIAGNOSIS — N529 Male erectile dysfunction, unspecified: Secondary | ICD-10-CM

## 2013-01-15 DIAGNOSIS — E119 Type 2 diabetes mellitus without complications: Secondary | ICD-10-CM

## 2013-01-15 LAB — COMPREHENSIVE METABOLIC PANEL
ALT: 11 U/L (ref 0–53)
AST: 22 U/L (ref 0–37)
Albumin: 3.8 g/dL (ref 3.5–5.2)
Alkaline Phosphatase: 63 U/L (ref 39–117)
BUN: 9 mg/dL (ref 6–23)
CO2: 24 mEq/L (ref 19–32)
Calcium: 9 mg/dL (ref 8.4–10.5)
Chloride: 106 mEq/L (ref 96–112)
Creat: 0.75 mg/dL (ref 0.50–1.35)
Glucose, Bld: 127 mg/dL — ABNORMAL HIGH (ref 70–99)
Potassium: 4.2 mEq/L (ref 3.5–5.3)
Sodium: 139 mEq/L (ref 135–145)
Total Bilirubin: 1.2 mg/dL (ref 0.3–1.2)
Total Protein: 6.6 g/dL (ref 6.0–8.3)

## 2013-01-15 LAB — POCT GLYCOSYLATED HEMOGLOBIN (HGB A1C): Hemoglobin A1C: 5.5

## 2013-01-15 LAB — MICROALBUMIN, URINE: Microalb, Ur: 0.5 mg/dL (ref 0.00–1.89)

## 2013-01-15 MED ORDER — SILVER SULFADIAZINE 1 % EX CREA: TOPICAL_CREAM | Freq: Every day | CUTANEOUS | Status: DC

## 2013-01-15 NOTE — Patient Instructions (Addendum)
Recheck the foot in 1 week

## 2013-01-15 NOTE — Progress Notes (Signed)
57 yo diabetic smoker with past right toe amputations and diabetic peripheral neuropathy.  He also has ED with no improvement using Viagra.  He had a history of alcohol abuse. He noted a right foot ulcer after working in shop out back and the great toe stump developed a blister.  Diabetic shoes are arriving Tuesday.  Set quit date for cigarettes tomorrow.  Seeing Kirby Crigler, MD in [place] for pain management. Seeing Forde Radon, MD in [facility] for chronic liver disease  Blood sugars have been well controlled.  He has not been drinking.  Objective:  NAD.  Alert with slurred speech Right foot:  Pulse present.  Good color.  1 cm open blister over right great toe stump.  No peripheral erythema or swelling.  We discussed ED, diabetes.  Decreased feeling in distal feet. Results for orders placed in visit on 01/15/13  POCT GLYCOSYLATED HEMOGLOBIN (HGB A1C)      Result Value Range   Hemoglobin A1C 5.5       Assessment:  Toe blister.  Multiple chronic problems:  Liver, smoking, diabetes, pain from neuropathy in feet  Plan: recheck 1 week Use silvadene dressings and leave open to air whenever possible Foot ulcer, right, limited to breakdown of skin - Plan: silver sulfADIAZINE (SILVADENE) 1 % cream  Type 2 diabetes mellitus - Plan: POCT glycosylated hemoglobin (Hb A1C), Comprehensive metabolic panel, Microalbumin, urine    Elvina Sidle, MD

## 2013-01-21 ENCOUNTER — Ambulatory Visit (INDEPENDENT_AMBULATORY_CARE_PROVIDER_SITE_OTHER): Payer: Medicare HMO | Admitting: Family Medicine

## 2013-01-21 VITALS — BP 116/71 | HR 52 | Temp 98.0°F | Resp 18 | Ht 73.0 in | Wt 262.0 lb

## 2013-01-21 DIAGNOSIS — L0291 Cutaneous abscess, unspecified: Secondary | ICD-10-CM

## 2013-01-21 DIAGNOSIS — E11621 Type 2 diabetes mellitus with foot ulcer: Secondary | ICD-10-CM

## 2013-01-21 DIAGNOSIS — E1169 Type 2 diabetes mellitus with other specified complication: Secondary | ICD-10-CM

## 2013-01-21 DIAGNOSIS — L0390 Cellulitis, unspecified: Secondary | ICD-10-CM

## 2013-01-21 LAB — POCT CBC
Granulocyte percent: 67.9 %G (ref 37–80)
HCT, POC: 41.9 % — AB (ref 43.5–53.7)
Hemoglobin: 13.6 g/dL — AB (ref 14.1–18.1)
Lymph, poc: 1.9 (ref 0.6–3.4)
MCH, POC: 29.4 pg (ref 27–31.2)
MCHC: 32.5 g/dL (ref 31.8–35.4)
MCV: 90.6 fL (ref 80–97)
MID (cbc): 0.6 (ref 0–0.9)
MPV: 8.6 fL (ref 0–99.8)
POC Granulocyte: 5.3 (ref 2–6.9)
POC LYMPH PERCENT: 24.1 %L (ref 10–50)
POC MID %: 8 %M (ref 0–12)
Platelet Count, POC: 139 10*3/uL — AB (ref 142–424)
RBC: 4.62 M/uL — AB (ref 4.69–6.13)
RDW, POC: 14.6 %
WBC: 7.8 10*3/uL (ref 4.6–10.2)

## 2013-01-21 LAB — POCT SEDIMENTATION RATE: POCT SED RATE: 66 mm/hr — AB (ref 0–22)

## 2013-01-21 MED ORDER — SULFAMETHOXAZOLE-TRIMETHOPRIM 800-160 MG PO TABS: 1.0000 | ORAL_TABLET | Freq: Two times a day (BID) | ORAL | Status: DC

## 2013-01-21 NOTE — Progress Notes (Signed)
57 yo diabetic man with 9 days of blistered right great toe stump.  Over the past several days, the surrounding area has turned red, although there is no increased pain or swelling.  There is an odor that is reminiscent of past gangrene infection in toes.  He has had some good success with bactrim in the past.  Objective:  NAD Right foot:  2-3 cm round ulcer right great toe stump with 2 cm surrounding erythema  Assessment:  Early cellulitis  Plan:  Follow up with foot doctor in [place] at 9:30 Bactrim DS bid Silvadene cream Dressing Diabetic shoes.  Lona Millard, MD

## 2013-01-24 LAB — WOUND CULTURE
Gram Stain: NONE SEEN
Gram Stain: NONE SEEN

## 2013-02-07 ENCOUNTER — Encounter: Payer: Self-pay | Admitting: Family Medicine

## 2013-02-07 ENCOUNTER — Ambulatory Visit (INDEPENDENT_AMBULATORY_CARE_PROVIDER_SITE_OTHER): Payer: Medicare HMO | Admitting: Family Medicine

## 2013-02-07 VITALS — BP 120/60 | HR 55 | Temp 97.8°F | Resp 18 | Ht 73.0 in | Wt 260.0 lb

## 2013-02-07 DIAGNOSIS — M25519 Pain in unspecified shoulder: Secondary | ICD-10-CM

## 2013-02-07 DIAGNOSIS — Z5189 Encounter for other specified aftercare: Secondary | ICD-10-CM

## 2013-02-07 DIAGNOSIS — M25512 Pain in left shoulder: Secondary | ICD-10-CM

## 2013-02-07 DIAGNOSIS — S91301D Unspecified open wound, right foot, subsequent encounter: Secondary | ICD-10-CM

## 2013-02-07 MED ORDER — KETOROLAC TROMETHAMINE 60 MG/2ML IM SOLN
60.0000 mg | Freq: Once | INTRAMUSCULAR | Status: AC
  Administered 2013-02-07: 60 mg via INTRAMUSCULAR

## 2013-02-07 MED ORDER — METHYLPREDNISOLONE ACETATE 80 MG/ML IJ SUSP: 80.0000 mg | Freq: Once | INTRAMUSCULAR | Status: DC

## 2013-02-07 MED ORDER — METHYLPREDNISOLONE ACETATE 80 MG/ML IJ SUSP
80.0000 mg | Freq: Once | INTRAMUSCULAR | Status: AC
  Administered 2013-02-07: 80 mg via INTRA_ARTICULAR

## 2013-02-07 MED ORDER — KETOROLAC TROMETHAMINE 60 MG/2ML IM SOLN: 60.0000 mg | Freq: Once | INTRAMUSCULAR | Status: DC

## 2013-02-07 NOTE — Progress Notes (Signed)
57 year old gentleman with diabetes, long history of alcoholism, cigarette smoking. He comes in today with acute left shoulder pain, similar to what is having the past. He's been taking oxycodone liberally overnight and says it doesn't help him. He was the strongest pain shot we have.  Patient also would like to have [place] look at his right foot because of trouble healing the past surgical area.  Objective: Patient is diffusely tender around his right shoulder in the anterior and posterior joint line regions. He is able to move his arm normally except for raising it above shoulder causes extreme pain. There is no swelling or ecchymosis around the shoulder.  Right toe stump shows good healing with clean base. About 1 cm across with fresh clean granulation tissue.  Patient has some slurred speech and cautioned him against driving while he's been taking pain medicine.  Right posterior shoulder joint line injected without complication.   Assessment: Right shoulder pain with acute arthralgia, healing diabetic foot ulcer  Plan: Continue Silvadene dressing changes, Pain in joint, shoulder region, left - Plan: methylPREDNISolone acetate (DEPO-MEDROL) injection 80 mg, ketorolac (TORADOL) injection 60 mg  Open wound of right foot, subsequent encounter  Signed, Elvina Sidle, MD

## 2013-03-02 ENCOUNTER — Encounter: Payer: Medicare HMO | Admitting: Family Medicine

## 2013-03-16 ENCOUNTER — Telehealth: Payer: Self-pay

## 2013-03-16 NOTE — Telephone Encounter (Signed)
Do you want him to have a vacuum erection device?

## 2013-03-16 NOTE — Telephone Encounter (Signed)
[employer] is calling to get pre-authorization for a vacuum device. They also need the OV notes from date of service 01/15/2013. Phone #: [phone]. Fax: [phone]

## 2013-03-17 NOTE — Telephone Encounter (Signed)
These devices don't work very well, but if really wants to try it, I can order one for him

## 2013-03-17 NOTE — Telephone Encounter (Signed)
Did you get a form for this? I have not seen it.

## 2013-03-20 ENCOUNTER — Telehealth: Payer: Self-pay

## 2013-03-20 NOTE — Telephone Encounter (Signed)
[employer] WOULD LIKE TO HAVE THE OV NOTES FROM June REGARDING PT'S FOR HIS MEDICATION PLEASE FAX TO [phone] AND THE PHONE NUMBER IS [phone]

## 2013-03-21 NOTE — Telephone Encounter (Signed)
Faxed

## 2013-03-21 NOTE — Telephone Encounter (Signed)
I have not seen this order. I called [employer] and asked for them to re-fax order.

## 2013-03-23 ENCOUNTER — Ambulatory Visit (INDEPENDENT_AMBULATORY_CARE_PROVIDER_SITE_OTHER): Payer: Medicare HMO | Admitting: Family Medicine

## 2013-03-23 ENCOUNTER — Encounter: Payer: Self-pay | Admitting: Family Medicine

## 2013-03-23 VITALS — BP 109/65 | HR 50 | Temp 98.2°F | Resp 16 | Ht 72.0 in | Wt 260.0 lb

## 2013-03-23 DIAGNOSIS — Z0000 Encounter for general adult medical examination without abnormal findings: Secondary | ICD-10-CM

## 2013-03-23 DIAGNOSIS — IMO0001 Reserved for inherently not codable concepts without codable children: Secondary | ICD-10-CM

## 2013-03-23 LAB — IFOBT (OCCULT BLOOD): IFOBT: NEGATIVE

## 2013-03-23 NOTE — Progress Notes (Signed)
  Subjective:    Patient ID: [name], male    DOB: 05-22-1956, 57 y.o.   MRN: [id number]  HPI  57 yo with diabetes, multiple amputations, expecting revision right great toe stump by Dr. Bennett Scrape at [facility] in September. In pain management in [place].  Review of Systems  Constitutional: Positive for unexpected weight change.  Gastrointestinal: Positive for constipation.  Musculoskeletal: Positive for arthralgias.   No alcohol x 7 years Still smoking Colonoscopy 7 years ago Last Td < 5 years    Objective:   Physical Exam NAD, obese.  Talkative, good eye contact. HEENT:  Unremarkable; upper and lower dentures Neck:  No bruits, thyromegaly, or adenopathy Chest:  Clear Heart:  Reg, no murmur Abdomen:  Soft, nontender, no HSM or masses.   Rectal:  Normal prostate, hemossure taken Ext:  Open sore right great toe stump, all other toes amputated; decreased sensation entire feet  Loss of intrinsics in right hand (muscle wasting) Results for orders placed in visit on 03/23/13  IFOBT (OCCULT BLOOD)      Result Value Range   IFOBT Negative         Assessment & Plan:  Routine general medical examination at a health care facility - Plan: IFOBT POC (occult bld, rslt in office) Diabetes with h/o alcohol abuse (remote) and hepatitis(being cared for at [facility]) Signed, Elvina Sidle, MD

## 2013-10-08 ENCOUNTER — Ambulatory Visit (INDEPENDENT_AMBULATORY_CARE_PROVIDER_SITE_OTHER): Payer: Medicare HMO | Admitting: Family Medicine

## 2013-10-08 VITALS — BP 132/84 | HR 62 | Temp 97.7°F | Resp 18 | Ht 72.0 in | Wt 277.0 lb

## 2013-10-08 DIAGNOSIS — I10 Essential (primary) hypertension: Secondary | ICD-10-CM

## 2013-10-08 DIAGNOSIS — F411 Generalized anxiety disorder: Secondary | ICD-10-CM

## 2013-10-08 MED ORDER — DIAZEPAM 5 MG PO TABS: 5.0000 mg | ORAL_TABLET | Freq: Three times a day (TID) | ORAL | Status: DC | PRN

## 2013-10-08 NOTE — Progress Notes (Signed)
   Subjective:    Patient ID: [name], male    DOB: 1956/07/01, 58 y.o.   MRN: [id number]  HPI This chart was scribed for Elvina SidleKurt Lulia Schriner, MD by Andrew Auaven Small, ED Scribe. This patient was seen in room 1 and the patient's care was started at 3:26 PM.  HPI Comments: [name] is a 58 y.o. male who [facility] the [facility]. Pt reports that he has had a colonoscopy. Pt reports that he received the flu shot 2 months ago. He states that he had the pneumonia vaccine last year. Pt reports that he has good eye site.   Past Medical History  Diagnosis Date  . Cirrhosis   . Depression   . Diabetes mellitus without complication   . Foot pain   . Anxiety   . Hypertension    Allergies  Allergen Reactions  . Cephalexin    Prior to Admission medications   Medication Sig Start Date End Date Taking? Authorizing Provider  amitriptyline (ELAVIL) 50 MG tablet Take 50 mg by mouth at bedtime.   Yes Historical Provider, MD  diazepam (VALIUM) 5 MG tablet Take 5 mg by mouth 3 (three) times daily as needed.   Yes Historical Provider, MD  glucose blood (CHOICE DM FORA G20 TEST STRIPS) test strip To check blood sugar three times daily. Freestyle test strips/ dx code 250.00 06/15/12  Yes Elvina SidleKurt Rahima Fleishman, MD  Multiple Vitamin (MULTIVITAMIN) tablet Take 1 tablet by mouth daily.   Yes Historical Provider, MD  nadolol (CORGARD) 20 MG tablet Take 20 mg by mouth daily.   Yes Historical Provider, MD  oxyCODONE (ROXICODONE) 15 MG immediate release tablet Take 15 mg by mouth 4 (four) times daily.   Yes Historical Provider, MD  spironolactone (ALDACTONE) 25 MG tablet Take 25 mg by mouth 2 (two) times daily.   Yes Historical Provider, MD  thiamine (VITAMIN B-1) 100 MG tablet Take 100 mg by mouth daily.   Yes Historical Provider, MD  PARoxetine (PAXIL) 10 MG tablet Take 10 mg by mouth daily.    Historical Provider, MD  silver sulfADIAZINE  (SILVADENE) 1 % cream Apply topically daily. 01/15/13   Elvina SidleKurt Huntington Leverich, MD  sulfamethoxazole-trimethoprim (BACTRIM DS,SEPTRA DS) 800-160 MG per tablet Take 1 tablet by mouth 2 (two) times daily. 01/21/13   Elvina SidleKurt Nthony Lefferts, MD   Review of Systems     Objective:   Physical Exam  Nursing note and vitals reviewed. Constitutional: He is oriented to person, place, and time. He appears well-developed and well-nourished. No distress.  HENT:  Head: Normocephalic and atraumatic.  Eyes: EOM are normal.  Neck: Neck supple.  Cardiovascular: Normal rate.   Pulmonary/Chest: Effort normal.  Musculoskeletal: Normal range of motion.  Neurological: He is alert and oriented to person, place, and time.  Skin: Skin is warm and dry.  Psychiatric: He has a normal mood and affect. His behavior is normal.   Patient is somewhat slow to respond and has slightly slurred speech.  Forms filled out for [employer]    Assessment & Plan:  Hypertension - Plan: LDL Cholesterol, Direct  Anxiety state, unspecified - Plan: diazepam (VALIUM) 5 MG tablet  Signed, Elvina SidleKurt Hughey Rittenberry, MD

## 2013-10-09 LAB — LDL CHOLESTEROL, DIRECT: Direct LDL: 91 mg/dL

## 2013-10-20 ENCOUNTER — Telehealth: Payer: Self-pay

## 2013-10-20 NOTE — Telephone Encounter (Signed)
Pt dropped off [employer] form for us to fill in values of labs/vitals we have on record. I completed the info we have and copied for scanning. Called pt to notify ready.

## 2013-10-25 ENCOUNTER — Other Ambulatory Visit: Payer: Self-pay

## 2013-10-25 MED ORDER — BLOOD GLUCOSE METER KIT: PACK | Status: AC

## 2013-10-25 MED ORDER — GLUCOSE BLOOD VI STRP: ORAL_STRIP | Status: DC

## 2013-10-25 MED ORDER — LANCETS MISC: Status: DC

## 2013-10-25 MED ORDER — ALCOHOL SWABS PADS: MEDICATED_PAD | Status: AC

## 2013-10-25 MED ORDER — UNABLE TO FIND: Status: DC

## 2013-11-02 ENCOUNTER — Telehealth: Payer: Self-pay

## 2013-11-02 NOTE — Telephone Encounter (Signed)
Patient called stated [employer], stated the company faxed over a request for his blood scores. Please Fax over results to  [phone]. Patient was very upset and demanded answers to why his blood scores was not faxed over to [employer].

## 2013-11-03 NOTE — Telephone Encounter (Signed)
Pt would like for us to send [employer] a copy of his A1c, he states that they have faxed us several times and were unable to receive a response. He states that the fax number is [phone].  Best# [phone]

## 2013-11-03 NOTE — Telephone Encounter (Signed)
Medical Records- has no record of this form to be scanned.

## 2013-11-03 NOTE — Telephone Encounter (Signed)
The form is in my box. It looks like this form is for the patient to keep track of his screening dates. Is there another form that the patient needs to have sent in? Called and left message.

## 2013-11-06 NOTE — Telephone Encounter (Signed)
We haven't done an A1C sine 01/2013. [facility]

## 2014-01-22 ENCOUNTER — Telehealth: Payer: Self-pay | Admitting: *Deleted

## 2014-01-22 ENCOUNTER — Ambulatory Visit (INDEPENDENT_AMBULATORY_CARE_PROVIDER_SITE_OTHER): Payer: Medicare HMO | Admitting: Family Medicine

## 2014-01-22 ENCOUNTER — Encounter: Payer: Self-pay | Admitting: Family Medicine

## 2014-01-22 DIAGNOSIS — E119 Type 2 diabetes mellitus without complications: Secondary | ICD-10-CM

## 2014-01-22 LAB — POCT GLYCOSYLATED HEMOGLOBIN (HGB A1C): Hemoglobin A1C: 6.2

## 2014-01-22 NOTE — Telephone Encounter (Signed)
[facility]ailed [facility] and [facility] per Dr Milus GlazierLauenstein. Also, patient was given copies for his records.
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
## 2014-03-05 DIAGNOSIS — I8500 Esophageal varices without bleeding: Secondary | ICD-10-CM | POA: Insufficient documentation

## 2014-03-30 ENCOUNTER — Encounter: Payer: Self-pay | Admitting: Family Medicine

## 2014-04-03 ENCOUNTER — Ambulatory Visit (INDEPENDENT_AMBULATORY_CARE_PROVIDER_SITE_OTHER): Payer: Medicare HMO | Admitting: Family Medicine

## 2014-04-03 VITALS — BP 120/76 | HR 56 | Temp 97.6°F | Resp 17 | Ht 73.5 in | Wt 264.0 lb

## 2014-04-03 DIAGNOSIS — E1149 Type 2 diabetes mellitus with other diabetic neurological complication: Secondary | ICD-10-CM

## 2014-04-03 DIAGNOSIS — E1142 Type 2 diabetes mellitus with diabetic polyneuropathy: Secondary | ICD-10-CM
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
## 2014-04-13 ENCOUNTER — Ambulatory Visit (INDEPENDENT_AMBULATORY_CARE_PROVIDER_SITE_OTHER): Payer: Medicare HMO | Admitting: Family Medicine

## 2014-04-13 VITALS — BP 130/70 | HR 53 | Temp 97.7°F | Resp 20 | Ht 72.0 in | Wt 265.2 lb

## 2014-04-13 DIAGNOSIS — F3289 Other specified depressive episodes: Secondary | ICD-10-CM

## 2014-04-13 DIAGNOSIS — F32A Depression, unspecified: Secondary | ICD-10-CM

## 2014-04-13 DIAGNOSIS — F329 Major depressive disorder, single episode, unspecified: Secondary | ICD-10-CM

## 2014-04-13 DIAGNOSIS — K7460 Unspecified cirrhosis of liver: Secondary | ICD-10-CM

## 2014-04-13 MED ORDER — PAROXETINE HCL 10 MG PO TABS: 10.0000 mg | ORAL_TABLET | Freq: Every day | ORAL | Status: DC

## 2014-04-13 MED ORDER — SPIRONOLACTONE 50 MG PO TABS: 50.0000 mg | ORAL_TABLET | Freq: Every day | ORAL | Status: DC

## 2014-04-13 NOTE — Progress Notes (Signed)
58 yo diabetic, hypertensive man with past amputations who is here for refill of his Paxil.  He is doing well on the medicine. Has skin graft due the 17th of Sept. Right foot.  (Dr. Bennett Scrape in [place])  Objective:  NAD Results for orders placed in visit on 01/22/14  POCT GLYCOSYLATED HEMOGLOBIN (HGB A1C)      Result Value Ref Range   Hemoglobin A1C 6.2      Chest: clear Heart:  Reg, distant, no murmur  Assessment:  Stable on current meds.  Depression  Cirrhosis of liver without mention of alcohol  The patient's status has not changed significantly. He continues to have slurred speech, but is able to perform all ADLs. He relies on his ex wife for transportation. He lives with another former alcoholic.  Signed, Elvina Sidle, MD

## 2014-08-07 ENCOUNTER — Ambulatory Visit (INDEPENDENT_AMBULATORY_CARE_PROVIDER_SITE_OTHER): Payer: Medicare HMO | Admitting: Family Medicine

## 2014-08-07 VITALS — BP 122/62 | HR 58 | Temp 97.6°F | Resp 18 | Ht 71.25 in | Wt 266.0 lb

## 2014-08-07 DIAGNOSIS — L97519 Non-pressure chronic ulcer of other part of right foot with unspecified severity: Secondary | ICD-10-CM

## 2014-08-07 DIAGNOSIS — M25571 Pain in right ankle and joints of right foot: Secondary | ICD-10-CM

## 2014-08-07 MED ORDER — OXYCODONE HCL 15 MG PO TABS: 15.0000 mg | ORAL_TABLET | Freq: Four times a day (QID) | ORAL | Status: AC
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
## 2014-08-13 DIAGNOSIS — E1142 Type 2 diabetes mellitus with diabetic polyneuropathy: Secondary | ICD-10-CM | POA: Diagnosis not present

## 2014-08-13 DIAGNOSIS — Z79891 Long term (current) use of opiate analgesic: Secondary | ICD-10-CM | POA: Diagnosis not present

## 2014-08-13 DIAGNOSIS — M5136 Other intervertebral disc degeneration, lumbar region: Secondary | ICD-10-CM | POA: Diagnosis not present

## 2014-08-13 DIAGNOSIS — G894 Chronic pain syndrome: Secondary | ICD-10-CM | POA: Diagnosis not present

## 2014-08-13 DIAGNOSIS — M86669 Other chronic osteomyelitis, unspecified tibia and fibula: Secondary | ICD-10-CM | POA: Diagnosis not present

## 2014-08-21 ENCOUNTER — Telehealth: Payer: Self-pay | Admitting: *Deleted

## 2014-08-21 NOTE — Telephone Encounter (Signed)
Attempting to reach patient regarding Diabetic Eye Exam and/or eye doctor information.

## 2014-08-22 DIAGNOSIS — E11621 Type 2 diabetes mellitus with foot ulcer: Secondary | ICD-10-CM | POA: Diagnosis not present

## 2014-08-22 DIAGNOSIS — Z89431 Acquired absence of right foot: Secondary | ICD-10-CM | POA: Diagnosis not present

## 2014-08-22 DIAGNOSIS — L97519 Non-pressure chronic ulcer of other part of right foot with unspecified severity: Secondary | ICD-10-CM | POA: Diagnosis not present

## 2014-08-22 DIAGNOSIS — T148 Other injury of unspecified body region: Secondary | ICD-10-CM | POA: Diagnosis not present

## 2014-08-29 DIAGNOSIS — F1021 Alcohol dependence, in remission: Secondary | ICD-10-CM | POA: Diagnosis not present

## 2014-08-29 DIAGNOSIS — I10 Essential (primary) hypertension: Secondary | ICD-10-CM | POA: Diagnosis not present

## 2014-08-29 DIAGNOSIS — E11621 Type 2 diabetes mellitus with foot ulcer: Secondary | ICD-10-CM | POA: Diagnosis not present

## 2014-08-29 DIAGNOSIS — Z79899 Other long term (current) drug therapy: Secondary | ICD-10-CM | POA: Diagnosis not present

## 2014-08-29 DIAGNOSIS — G894 Chronic pain syndrome: Secondary | ICD-10-CM | POA: Diagnosis not present

## 2014-08-29 DIAGNOSIS — F329 Major depressive disorder, single episode, unspecified: Secondary | ICD-10-CM | POA: Diagnosis not present

## 2014-08-29 DIAGNOSIS — F1011 Alcohol abuse, in remission: Secondary | ICD-10-CM | POA: Insufficient documentation

## 2014-08-29 DIAGNOSIS — Z89412 Acquired absence of left great toe: Secondary | ICD-10-CM | POA: Diagnosis not present

## 2014-08-29 DIAGNOSIS — F17200 Nicotine dependence, unspecified, uncomplicated: Secondary | ICD-10-CM | POA: Diagnosis not present

## 2014-08-29 DIAGNOSIS — L089 Local infection of the skin and subcutaneous tissue, unspecified: Secondary | ICD-10-CM | POA: Diagnosis not present

## 2014-08-29 DIAGNOSIS — Z89422 Acquired absence of other left toe(s): Secondary | ICD-10-CM | POA: Diagnosis not present

## 2014-08-29 DIAGNOSIS — I739 Peripheral vascular disease, unspecified: Secondary | ICD-10-CM | POA: Diagnosis not present

## 2014-08-29 DIAGNOSIS — L97519 Non-pressure chronic ulcer of other part of right foot with unspecified severity: Secondary | ICD-10-CM | POA: Diagnosis not present

## 2014-08-29 DIAGNOSIS — Z23 Encounter for immunization: Secondary | ICD-10-CM | POA: Diagnosis not present

## 2014-08-29 DIAGNOSIS — T8189XA Other complications of procedures, not elsewhere classified, initial encounter: Secondary | ICD-10-CM | POA: Diagnosis not present

## 2014-08-29 DIAGNOSIS — E1149 Type 2 diabetes mellitus with other diabetic neurological complication: Secondary | ICD-10-CM | POA: Insufficient documentation

## 2014-08-29 DIAGNOSIS — D649 Anemia, unspecified: Secondary | ICD-10-CM | POA: Diagnosis not present

## 2014-08-29 DIAGNOSIS — E11622 Type 2 diabetes mellitus with other skin ulcer: Secondary | ICD-10-CM | POA: Insufficient documentation

## 2014-08-29 DIAGNOSIS — Z89431 Acquired absence of right foot: Secondary | ICD-10-CM | POA: Diagnosis not present

## 2014-08-29 DIAGNOSIS — Z888 Allergy status to other drugs, medicaments and biological substances status: Secondary | ICD-10-CM | POA: Diagnosis not present

## 2014-08-29 DIAGNOSIS — Z881 Allergy status to other antibiotic agents status: Secondary | ICD-10-CM | POA: Diagnosis not present

## 2014-08-29 DIAGNOSIS — K7030 Alcoholic cirrhosis of liver without ascites: Secondary | ICD-10-CM | POA: Diagnosis not present

## 2014-08-29 DIAGNOSIS — E1142 Type 2 diabetes mellitus with diabetic polyneuropathy: Secondary | ICD-10-CM | POA: Diagnosis not present

## 2014-08-29 DIAGNOSIS — L98499 Non-pressure chronic ulcer of skin of other sites with unspecified severity: Secondary | ICD-10-CM

## 2014-08-29 DIAGNOSIS — F419 Anxiety disorder, unspecified: Secondary | ICD-10-CM | POA: Diagnosis not present

## 2014-09-01 DIAGNOSIS — Z888 Allergy status to other drugs, medicaments and biological substances status: Secondary | ICD-10-CM | POA: Diagnosis not present

## 2014-09-01 DIAGNOSIS — I739 Peripheral vascular disease, unspecified: Secondary | ICD-10-CM | POA: Diagnosis not present

## 2014-09-01 DIAGNOSIS — D649 Anemia, unspecified: Secondary | ICD-10-CM | POA: Diagnosis not present

## 2014-09-01 DIAGNOSIS — L089 Local infection of the skin and subcutaneous tissue, unspecified: Secondary | ICD-10-CM | POA: Diagnosis not present

## 2014-09-01 DIAGNOSIS — F419 Anxiety disorder, unspecified: Secondary | ICD-10-CM | POA: Diagnosis not present

## 2014-09-01 DIAGNOSIS — F1021 Alcohol dependence, in remission: Secondary | ICD-10-CM | POA: Diagnosis not present

## 2014-09-01 DIAGNOSIS — Z89412 Acquired absence of left great toe: Secondary | ICD-10-CM | POA: Diagnosis not present

## 2014-09-01 DIAGNOSIS — Z89422 Acquired absence of other left toe(s): Secondary | ICD-10-CM | POA: Diagnosis not present

## 2014-09-01 DIAGNOSIS — T8189XA Other complications of procedures, not elsewhere classified, initial encounter: Secondary | ICD-10-CM | POA: Diagnosis not present

## 2014-09-01 DIAGNOSIS — Z79899 Other long term (current) drug therapy: Secondary | ICD-10-CM | POA: Diagnosis not present

## 2014-09-01 DIAGNOSIS — F17200 Nicotine dependence, unspecified, uncomplicated: Secondary | ICD-10-CM | POA: Diagnosis not present

## 2014-09-01 DIAGNOSIS — E11621 Type 2 diabetes mellitus with foot ulcer: Secondary | ICD-10-CM | POA: Diagnosis not present

## 2014-09-01 DIAGNOSIS — K7030 Alcoholic cirrhosis of liver without ascites: Secondary | ICD-10-CM | POA: Diagnosis not present

## 2014-09-01 DIAGNOSIS — L97519 Non-pressure chronic ulcer of other part of right foot with unspecified severity: Secondary | ICD-10-CM | POA: Diagnosis not present

## 2014-09-01 DIAGNOSIS — G894 Chronic pain syndrome: Secondary | ICD-10-CM | POA: Diagnosis not present

## 2014-09-01 DIAGNOSIS — E1149 Type 2 diabetes mellitus with other diabetic neurological complication: Secondary | ICD-10-CM | POA: Diagnosis not present

## 2014-09-01 DIAGNOSIS — Z23 Encounter for immunization: Secondary | ICD-10-CM | POA: Diagnosis not present

## 2014-09-01 DIAGNOSIS — I10 Essential (primary) hypertension: Secondary | ICD-10-CM | POA: Diagnosis not present

## 2014-09-01 DIAGNOSIS — Z881 Allergy status to other antibiotic agents status: Secondary | ICD-10-CM | POA: Diagnosis not present

## 2014-09-01 DIAGNOSIS — E1142 Type 2 diabetes mellitus with diabetic polyneuropathy: Secondary | ICD-10-CM | POA: Diagnosis not present

## 2014-09-01 DIAGNOSIS — F329 Major depressive disorder, single episode, unspecified: Secondary | ICD-10-CM | POA: Diagnosis not present

## 2014-09-12 DIAGNOSIS — L97509 Non-pressure chronic ulcer of other part of unspecified foot with unspecified severity: Secondary | ICD-10-CM | POA: Diagnosis not present

## 2014-10-06 ENCOUNTER — Ambulatory Visit (INDEPENDENT_AMBULATORY_CARE_PROVIDER_SITE_OTHER): Payer: Medicare Other | Admitting: Family Medicine

## 2014-10-06 VITALS — BP 97/66 | HR 60 | Temp 97.5°F | Resp 18

## 2014-10-06 DIAGNOSIS — E0842 Diabetes mellitus due to underlying condition with diabetic polyneuropathy: Secondary | ICD-10-CM

## 2014-10-06 DIAGNOSIS — Z89431 Acquired absence of right foot: Secondary | ICD-10-CM

## 2014-10-06 DIAGNOSIS — E119 Type 2 diabetes mellitus without complications: Secondary | ICD-10-CM | POA: Diagnosis not present

## 2014-10-06 LAB — POCT GLYCOSYLATED HEMOGLOBIN (HGB A1C): Hemoglobin A1C: 5.8

## 2014-10-06 LAB — GLUCOSE, POCT (MANUAL RESULT ENTRY): POC Glucose: 153 mg/dl — AB (ref 70–99)

## 2014-10-06 NOTE — Patient Instructions (Signed)
I'll see you next week.

## 2014-10-06 NOTE — Progress Notes (Addendum)
° °  Subjective:    Patient ID: [name], male    DOB: 07-08-1956, 59 y.o.   MRN: [id number] Levee3 This chart was scribed for Elvina SidleKurt Lauenstein, MD by Littie Deedsichard Sun, Medical Scribe. This patient was seen in Room 1 and the patient's care was started at 2:48 PM.   HPI HPI Comments: Austin LeveeJames M Simpson is a 59 y.o. male with a hx of DM and HTN who presents to the [facility] with forms needing to be filled out. Patient had amputation of his right toes done about 1 month ago by Dr. Bennett ScrapeVogler, DPM for diabetic neuropathy. He had been instructed to be non weight bearing, so he has been staying at home watching TV and movies.    Review of Systems     Objective:   Physical Exam CONSTITUTIONAL: Well developed/well nourished (obese) with slurred speech HEAD: Normocephalic/atraumatic EYES: EOM/PERRL ENMT: Mucous membranes moist NECK: supple no meningeal signs SPINE: entire spine nontender CV: S1/S2 noted, no murmurs/rubs/gallops noted LUNGS: Lungs are clear to auscultation bilaterally, no apparent distress ABDOMEN: soft, nontender, no rebound or guarding GU: no cva tenderness NEURO: Pt is awake/alert, moves all extremitiesx4 EXTREMITIES: Weak pulses right foot.  Patient is using a walker with a knee rest to keep the weight off his right foot. SKIN: warm, color normal PSYCH: no abnormalities of mood noted  I cannot palpate pulses in right foot The surgical wound site is dry, coated with betadine, and the wound edges are still held together with sutures.  There is 2+ pedal edema  Results for orders placed or performed in visit on 10/06/14  POCT glycosylated hemoglobin (Hb A1C)  Result Value Ref Range   Hemoglobin A1C 5.8   POCT glucose (manual entry)  Result Value Ref Range   POC Glucose 153 (A) 70 - 99 mg/dl       Assessment & Plan:   This chart was scribed in my presence and reviewed by me personally.    ICD-9-CM ICD-10-CM   1. Diabetes type 2, controlled 250.00 E11.9 POCT  glycosylated hemoglobin (Hb A1C)     POCT glucose (manual entry)     Comprehensive metabolic panel  2. S/P transmetatarsal amputation of foot, right V49.73 Z89.431   3. Diabetic polyneuropathy associated with diabetes mellitus due to underlying condition 249.60 E08.42    357.2     Patient is unable to undergo colonoscopy because his balance issues at this time.  Signed, Elvina SidleKurt Lauenstein, MD

## 2014-10-07 NOTE — Addendum Note (Signed)
Addended by: Maryann AlarBURNS, Celie Desrochers M on: 10/07/2014 01:11 PM   Modules accepted: Orders

## 2014-10-12 ENCOUNTER — Other Ambulatory Visit: Payer: Self-pay

## 2014-10-12 MED ORDER — NADOLOL 20 MG PO TABS: 20.0000 mg | ORAL_TABLET | Freq: Every day | ORAL | Status: AC

## 2014-10-12 MED ORDER — PAROXETINE HCL 10 MG PO TABS: 10.0000 mg | ORAL_TABLET | Freq: Every day | ORAL | Status: DC

## 2014-10-12 MED ORDER — SPIRONOLACTONE 50 MG PO TABS: 50.0000 mg | ORAL_TABLET | Freq: Every day | ORAL | Status: DC

## 2014-10-12 NOTE — Telephone Encounter (Signed)
Fax from [facility] requesting RFs of 3 meds. I RFd two, but don't see that you have written the nadolol for pt before. Do you want to Rx? It was on his current med list at last OV 10/06/14.

## 2014-11-26 ENCOUNTER — Other Ambulatory Visit: Payer: Self-pay

## 2014-11-26 MED ORDER — SPIRONOLACTONE 50 MG PO TABS: 50.0000 mg | ORAL_TABLET | Freq: Every day | ORAL | Status: AC

## 2015-02-19 ENCOUNTER — Telehealth: Payer: Self-pay

## 2015-02-19 NOTE — Telephone Encounter (Signed)
[facility] called just to report that they just checked pt's A1C and it was 7.1. She is required to report anything over 7.0, but says pt reports that his bi-weekly glucose readings range from 80-120 and pt seems to be doing well. Dr L, just FYI.

## 2015-05-01 ENCOUNTER — Other Ambulatory Visit: Payer: Self-pay | Admitting: Family Medicine

## 2015-06-12 ENCOUNTER — Other Ambulatory Visit: Payer: Self-pay | Admitting: Family Medicine

## 2015-06-18 ENCOUNTER — Telehealth: Payer: Self-pay | Admitting: Family Medicine

## 2015-06-18 NOTE — Telephone Encounter (Signed)
Left a message for patient to call and let me know if he has had an eye exam and if so where and when.

## 2015-06-19 ENCOUNTER — Telehealth: Payer: Self-pay | Admitting: Family Medicine

## 2015-06-19 NOTE — Telephone Encounter (Signed)
Unable to leave a message at this time.

## 2015-07-08 ENCOUNTER — Other Ambulatory Visit: Payer: Self-pay | Admitting: Family Medicine

## 2015-07-18 ENCOUNTER — Ambulatory Visit (INDEPENDENT_AMBULATORY_CARE_PROVIDER_SITE_OTHER): Payer: Medicare Other | Admitting: Family Medicine

## 2015-07-18 VITALS — BP 118/72 | HR 65 | Temp 98.3°F | Resp 18 | Ht 72.5 in | Wt 257.6 lb

## 2015-07-18 DIAGNOSIS — I10 Essential (primary) hypertension: Secondary | ICD-10-CM

## 2015-07-18 DIAGNOSIS — F329 Major depressive disorder, single episode, unspecified: Secondary | ICD-10-CM

## 2015-07-18 DIAGNOSIS — G4700 Insomnia, unspecified: Secondary | ICD-10-CM | POA: Diagnosis not present

## 2015-07-18 DIAGNOSIS — F32A Depression, unspecified: Secondary | ICD-10-CM

## 2015-07-18 DIAGNOSIS — E1142 Type 2 diabetes mellitus with diabetic polyneuropathy: Secondary | ICD-10-CM | POA: Diagnosis not present

## 2015-07-18 LAB — HEMOGLOBIN A1C: Hgb A1c MFr Bld: 7.5 % — AB (ref 4.0–6.0)

## 2015-07-18 LAB — POCT GLYCOSYLATED HEMOGLOBIN (HGB A1C): Hemoglobin A1C: 7.5

## 2015-07-18 MED ORDER — PAROXETINE HCL 10 MG PO TABS: ORAL_TABLET | ORAL | Status: DC

## 2015-07-18 MED ORDER — GLUCOSE BLOOD VI STRP: ORAL_STRIP | Status: DC

## 2015-07-18 MED ORDER — AMITRIPTYLINE HCL 100 MG PO TABS: 100.0000 mg | ORAL_TABLET | Freq: Every day | ORAL | Status: DC

## 2015-07-18 NOTE — Progress Notes (Signed)
° °  Subjective:    Patient ID: [name], male    DOB: 1956/05/27, 59 y.o.   MRN: [id number] By signing my name below, I, Littie Deedsichard Sun, attest that this documentation has been prepared under the direction and in the presence of Elvina SidleKurt Lauenstein, MD.  Electronically Signed: Littie Deedsichard Sun, Medical Scribe. 07/18/2015. 4:41 PM.  HPI HPI Comments: [name] is a 59 y.o. male with a history of cirrhosis, DM, and HTN who presents to the [facility] for a DMV physical exam. He does not wear corrective lenses. Patient denies history of MI. He denies drinking alcohol and illicit drug use. 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 Patient states he was [facility] foot [facility]. He has had amputations in both of his feet. His podiatrist is Dr. Bennett ScrapeVogler, DPM, and he is followed by Barkley BrunsErnest Hodges, [place] in [place] for pain management.  His blood sugars are normally in the 120s-130s range. He is requesting a prescription for Freestyle test strips. His last HbA1c was 5.8 on 2/27. His DM has been controlled with diet. Patient states he has had diabetes for about 18 years.  Patient notes he has been having difficulty sleeping. The current dose of Elavil has not been helping him sleep.  He would also like to come off of the Paxil.  Patient is not currently working.  Review of Systems  Psychiatric/Behavioral: Positive for sleep disturbance.       Objective:   Physical Exam CONSTITUTIONAL: Well developed/well nourished HEAD: Normocephalic/atraumatic EYES: EOM/PERRL ENMT: Mucous membranes moist NECK: supple no meningeal signs SPINE: entire spine nontender CV: S1/S2 noted, no murmurs/rubs/gallops noted LUNGS: Lungs are clear to auscultation bilaterally, no apparent distress PSYCH: no abnormalities of mood noted   DMV forms completed     Assessment & Plan:   This chart was scribed in my presence and reviewed by me personally.    ICD-9-CM  ICD-10-CM   1. Depression 311 F32.9 PARoxetine (PAXIL) 10 MG tablet  2. Insomnia 780.52 G47.00 amitriptyline (ELAVIL) 100 MG tablet  3. Controlled type 2 diabetes mellitus with diabetic polyneuropathy, without long-term current use of insulin (HCC) 250.60 E11.42 glucose blood test strip   357.2    4. Essential hypertension 401.9 I10      Signed, Elvina SidleKurt Lauenstein, MD

## 2015-07-19 LAB — COMPLETE METABOLIC PANEL WITHOUT GFR
ALT: 16 U/L (ref 9–46)
AST: 27 U/L (ref 10–35)
Albumin: 3.8 g/dL (ref 3.6–5.1)
Alkaline Phosphatase: 76 U/L (ref 40–115)
BUN: 5 mg/dL — ABNORMAL LOW (ref 7–25)
CO2: 30 mmol/L (ref 20–31)
Calcium: 9.8 mg/dL (ref 8.6–10.3)
Chloride: 104 mmol/L (ref 98–110)
Creat: 0.72 mg/dL (ref 0.70–1.33)
GFR, Est African American: >89 mL/min
GFR, Est Non African American: >89 mL/min
Glucose, Bld: 139 mg/dL — ABNORMAL HIGH (ref 65–99)
Potassium: 3.8 mmol/L (ref 3.5–5.3)
Sodium: 141 mmol/L (ref 135–146)
Total Bilirubin: 2.2 mg/dL — ABNORMAL HIGH (ref 0.2–1.2)
Total Protein: 7.4 g/dL (ref 6.1–8.1)

## 2015-07-25 ENCOUNTER — Encounter: Payer: Self-pay | Admitting: Family Medicine

## 2015-08-20 ENCOUNTER — Ambulatory Visit (INDEPENDENT_AMBULATORY_CARE_PROVIDER_SITE_OTHER): Payer: Medicare Other | Admitting: Family Medicine

## 2015-08-20 VITALS — BP 138/78 | HR 66 | Temp 98.1°F | Resp 16 | Ht 72.5 in | Wt 254.0 lb

## 2015-08-20 DIAGNOSIS — G4700 Insomnia, unspecified: Secondary | ICD-10-CM | POA: Diagnosis not present

## 2015-08-20 MED ORDER — DIAZEPAM 10 MG PO TABS: 10.0000 mg | ORAL_TABLET | Freq: Every evening | ORAL | Status: DC | PRN

## 2015-08-20 NOTE — Progress Notes (Signed)
   Subjective:    Patient ID: [name], male    DOB: 03-Jul-1956, 60 y.o.   MRN: [id number] By signing my name below, I, Littie Deedsichard Sun, attest that this documentation has been prepared under the direction and in the presence of Elvina SidleKurt Jerrika Ledlow, MD.  Electronically Signed: Littie Deedsichard Sun, Medical Scribe. 08/20/2015. 10:20 AM.  HPI HPI Comments: [name] is a 60 y.o. male with a history of DM who presents to the [facility] to discuss his medications. Patient notes he cannot take the amitriptyline anymore because they have been giving him strange dreams and they also caused him to sleepwalk. He did injure his knees while sleepwalking. Patient is requesting Valium instead; he has taken it in the past without any problems.  Patient states his blood sugars have been okay. He denies any problems with eating and digestion.  Lab Results  Component Value Date   HGBA1C 7.5 07/18/2015     Review of Systems  Constitutional: Negative for appetite change.  Gastrointestinal: Negative for diarrhea and constipation.  Psychiatric/Behavioral: Positive for sleep disturbance.       Objective:   Physical Exam CONSTITUTIONAL: Well developed/well nourished HEAD: Normocephalic/atraumatic EYES: EOM/PERRL ENMT: Mucous membranes moist NECK: supple no meningeal signs SPINE: entire spine nontender CV: S1/S2 noted, no murmurs noted. No bruit. Regular rate and rhythm. LUNGS: Lungs are clear to auscultation bilaterally, no apparent distress ABDOMEN: soft, nontender, no rebound or guarding GU: no cva tenderness NEURO: Pt is awake/alert, moves all extremitiesx4.  Amputation sites warm, well healed with good sensation EXTREMITIES: pulses normal, full ROM SKIN: warm, color normal PSYCH: no abnormalities of mood noted      Assessment & Plan:    This chart was scribed in my presence and reviewed by me personally.    ICD-9-CM ICD-10-CM   1. Insomnia 780.52 G47.00 diazepam (VALIUM) 10  MG tablet     Signed, Elvina SidleKurt Maryland Stell, MD

## 2015-11-02 ENCOUNTER — Ambulatory Visit (INDEPENDENT_AMBULATORY_CARE_PROVIDER_SITE_OTHER): Payer: Medicare Other | Admitting: Family Medicine

## 2015-11-02 VITALS — BP 110/64 | HR 64 | Temp 98.0°F | Resp 16 | Ht 72.5 in | Wt 247.0 lb

## 2015-11-02 DIAGNOSIS — L02511 Cutaneous abscess of right hand: Secondary | ICD-10-CM

## 2015-11-02 MED ORDER — DOXYCYCLINE HYCLATE 100 MG PO CAPS: 100.0000 mg | ORAL_CAPSULE | Freq: Two times a day (BID) | ORAL | Status: DC

## 2015-11-02 NOTE — Progress Notes (Signed)
Procedure: Patient anesthetized with 2% lidocaine with Epi. 1 cm incision with a 15 blade made and wound explored.  No splinter could be identified.  There was pus. Per Dr. Milus GlazierLauenstein will start Doxycycline. He is to return on Tuesday for recheck.   Austin BostonMichael Ziah Turvey, MS, PA-C 11:10 AM, 11/02/2015

## 2015-11-02 NOTE — Patient Instructions (Addendum)
WOUND CARE Please return in 3 days to have your stitches/staples removed or sooner if you have concerns. Marland Kitchen. Keep area clean and dry for 24 hours. Do not remove bandage, if applied. . After 24 hours, remove bandage and wash wound gently with mild soap and warm water. Reapply a new bandage after cleaning wound, if directed. . Continue daily cleansing with soap and water until stitches/staples are removed. . Do not apply any ointments or creams to the wound while stitches/staples are in place, as this may cause delayed healing. . Notify the office if you experience any of the following signs of infection: Swelling, redness, pus drainage, streaking, fever >101.0 F . Notify the office if you experience excessive bleeding that does not stop after 15-20 minutes of constant, firm pressure.      IF you received an x-ray today, you will receive an invoice from [facility]. Please contact [facility] at [phone] with questions or concerns regarding your invoice.   IF you received labwork today, you will receive an invoice from [facility]. Please contact [facility] at [phone] with questions or concerns regarding your invoice.   Our billing staff will not be able to assist you with questions regarding bills from these companies.  You will be contacted with the lab results as soon as they are available. The fastest way to get your results is to activate your My Chart account. Instructions are located on the last page of this paperwork. If you have not heard from us regarding the results in 2 weeks, please contact this office.

## 2015-11-02 NOTE — Progress Notes (Signed)
Subjective:    Patient ID: [name], male    DOB: [birth year], 58 y.o.   MRN: [id number] By signing my name below, I, [name], attest that this documentation has been prepared under the direction and in the presence of Austin Haber, MD. Electronically Signed: Soijett Simpson, ED Scribe. 11/02/2015. 10:24 AM.  Chief Complaint  Patient presents with  . Hand Injury    right hand infection     HPI   [name] is a 60 y.o. male with a medical hx of DM, HTN, cirrhosis, who presents to [facility] complaining of right hand injury occurring 1 week ago. He notes that he initially had a splinter to the palm of his right hand, that he doesn't believe that he removed thoroughly. Pt states that the area is now throbbing. Pt is having associated symptoms of wound to palm of right hand. He notes that he has not tried any medications for the relief of his symptoms. He denies swelling, fever, chills, and any other symptoms. Pt reports that he is having blood work and a physical completed by his insurance company in the next week, so he declined having blood work completed today.  Past Medical History  Diagnosis Date  . Cirrhosis ([place])   . Depression   . Diabetes mellitus without complication ([place])   . Foot pain   . Anxiety   . Hypertension    Allergies  Allergen Reactions  . Cephalexin    Current Outpatient Prescriptions on File Prior to Visit  Medication Sig Dispense Refill  . Alcohol Swabs PADS Check blood sugar daily 120 each 1  . Blood Glucose Monitoring Suppl (BLOOD GLUCOSE METER) kit Check blood sugar daily 1 each 0  . diazepam (VALIUM) 10 MG tablet Take 1 tablet (10 mg total) by mouth at bedtime as needed for anxiety. 30 tablet 5  . glucose blood (CHOICE DM FORA G20 TEST STRIPS) test strip To check blood sugar three times daily. Freestyle test strips/ dx code 250.00 100 each 12  . glucose blood test strip Check blood sugar daily 100 each 1  . Lancets MISC Check blood sugar daily.  100 each 1  . Multiple Vitamin (MULTIVITAMIN) tablet Take 1 tablet by mouth daily.    Marland Kitchen oxyCODONE (ROXICODONE) 15 MG immediate release tablet Take 1 tablet (15 mg total) by mouth 4 (four) times daily. 4 tablet 0  . thiamine (VITAMIN B-1) 100 MG tablet Take 100 mg by mouth daily.    . nadolol (CORGARD) 20 MG tablet Take 1 tablet (20 mg total) by mouth daily. (Patient not taking: Reported on 11/02/2015) 90 tablet 1  . spironolactone (ALDACTONE) 50 MG tablet Take 1 tablet (50 mg total) by mouth daily. (Patient not taking: Reported on 07/18/2015) 90 tablet 1  . UNABLE TO FIND ACCU-CHEK SMARTVIEW CONTROL SOLUTION. Check blood sugar daily (Patient not taking: Reported on 07/18/2015) 1 Bottle 1   No current facility-administered medications on file prior to visit.     Review of Systems  Constitutional: Negative for fever and chills.  Musculoskeletal: Negative for joint swelling.  Skin: Positive for wound.  All other systems reviewed and are negative.      Objective:   Physical Exam  Constitutional: He is oriented to person, place, and time. He appears well-developed and well-nourished. No distress.  HENT:  Head: Normocephalic and atraumatic.  Eyes: EOM are normal.  Neck: Neck supple.  Cardiovascular: Normal rate.   Pulmonary/Chest: Effort normal. No respiratory distress.  Musculoskeletal:  Normal range of motion.  Neurological: He is alert and oriented to person, place, and time.  Skin: Skin is warm and dry. There is erythema.  1 cm fluctuant raised tender, Simpson, abscess on the palm of right hand on the thenar surface.  Psychiatric: He has a normal mood and affect. His behavior is normal.  Nursing note and vitals reviewed.      BP 110/64 mmHg  Pulse 64  Temp(Src) 98 F (36.7 C) (Oral)  Resp 16  Ht 6' 0.5" (1.842 m)  Wt 247 lb (112.038 kg)  BMI 33.02 kg/m2  SpO2 97%  Assessment & Plan:   This chart was scribed in my presence and reviewed by me personally. Abscess.  Rx  doxycycline 100 bid x 10 ayd Recheck in 3 days  Signed, Austin Haber, MD

## 2015-11-05 ENCOUNTER — Telehealth: Payer: Self-pay

## 2015-11-05 ENCOUNTER — Ambulatory Visit (INDEPENDENT_AMBULATORY_CARE_PROVIDER_SITE_OTHER): Payer: Medicare Other

## 2015-11-05 ENCOUNTER — Ambulatory Visit (INDEPENDENT_AMBULATORY_CARE_PROVIDER_SITE_OTHER): Payer: Medicare Other | Admitting: Family Medicine

## 2015-11-05 VITALS — BP 140/80 | HR 79 | Temp 98.9°F | Resp 18 | Ht 72.5 in | Wt 255.0 lb

## 2015-11-05 DIAGNOSIS — R509 Fever, unspecified: Secondary | ICD-10-CM

## 2015-11-05 DIAGNOSIS — E1142 Type 2 diabetes mellitus with diabetic polyneuropathy: Secondary | ICD-10-CM | POA: Diagnosis not present

## 2015-11-05 DIAGNOSIS — S60521D Blister (nonthermal) of right hand, subsequent encounter: Principal | ICD-10-CM

## 2015-11-05 DIAGNOSIS — L089 Local infection of the skin and subcutaneous tissue, unspecified: Secondary | ICD-10-CM

## 2015-11-05 DIAGNOSIS — R059 Cough, unspecified: Secondary | ICD-10-CM

## 2015-11-05 DIAGNOSIS — R05 Cough: Secondary | ICD-10-CM

## 2015-11-05 LAB — POCT CBC
Granulocyte percent: 63.7 %G (ref 37–80)
HCT, POC: 37.7 % — AB (ref 43.5–53.7)
Hemoglobin: 13.6 g/dL — AB (ref 14.1–18.1)
Lymph, poc: 1.7 (ref 0.6–3.4)
MCH, POC: 30.8 pg (ref 27–31.2)
MCHC: 36 g/dL — AB (ref 31.8–35.4)
MCV: 85.5 fL (ref 80–97)
MID (cbc): 0.7 (ref 0–0.9)
MPV: 6.8 fL (ref 0–99.8)
POC Granulocyte: 4.1 (ref 2–6.9)
POC LYMPH PERCENT: 26 %L (ref 10–50)
POC MID %: 10.3 %M (ref 0–12)
Platelet Count, POC: 127 10*3/uL — AB (ref 142–424)
RBC: 4.41 M/uL — AB (ref 4.69–6.13)
RDW, POC: 13.7 %
WBC: 6.5 10*3/uL (ref 4.6–10.2)

## 2015-11-05 LAB — GLUCOSE, POCT (MANUAL RESULT ENTRY): POC Glucose: 298 mg/dl — AB (ref 70–99)

## 2015-11-05 MED ORDER — OSELTAMIVIR PHOSPHATE 75 MG PO CAPS
75.0000 mg | ORAL_CAPSULE | Freq: Two times a day (BID) | ORAL | Status: DC
Start: 2015-11-05 — End: 2016-01-23

## 2015-11-05 MED ORDER — HYDROCODONE-HOMATROPINE 5-1.5 MG/5ML PO SYRP: 5.0000 mL | ORAL_SOLUTION | Freq: Three times a day (TID) | ORAL | Status: DC | PRN

## 2015-11-05 NOTE — Patient Instructions (Addendum)
Your hand wound is healing well. Continue the antibiotics.  It appears that you now have the flu and I'm calling in some clue medicine. Please let me know if her symptoms are not better by the end of the week. If you're getting worse, return sooner.  Your blood sugars elevated and I think that's on the basis of the flu. Make sure you to take all your medicine for diabetes.

## 2015-11-05 NOTE — Telephone Encounter (Signed)
Pt states he went to get his medicine and it cost over $93.00. Need to have something else cheaper. Please call [phone]     [facility] [address]

## 2015-11-05 NOTE — Telephone Encounter (Signed)
Pt is needing to talk with someone about getting a new cough medication what was prescribed today was too expensive   Best number [phone]

## 2015-11-05 NOTE — Telephone Encounter (Signed)
Tamiflu, there is nothing equivalent to this. Does he have to get this?

## 2015-11-05 NOTE — Progress Notes (Signed)
This a 60 year old disabled man from diabetes area and comes in for recheck of his hand which was infected, and after it was explored, expressed a foreign body after the patient left the office, he was able to express the splinter. He feels that the infection is resolving.   He's had 2 days of cough and fever now which is getting worse. The cough is mildly productive. He is a known type II diabetic.  Objective:  BP 140/80 mmHg  Pulse 79  Temp(Src) 98.9 F (37.2 C) (Oral)  Resp 18  Ht 6' 0.5" (1.842 m)  Wt 255 lb (115.667 kg)  BMI 34.09 kg/m2  SpO2 96% No acute distress although patient is diaphoretic. HEENT: Unremarkable Chest: Bilateral rhonchi on inspiration and expiration. Heart: Regular without murmur Examination of the right palm reveals healing cellulitis and blister. He has good range of motion.  Results for orders placed or performed in visit on 11/05/15  POCT CBC  Result Value Ref Range   WBC 6.5 4.6 - 10.2 K/uL   Lymph, poc 1.7 0.6 - 3.4   POC LYMPH PERCENT 26.0 10 - 50 %L   MID (cbc) 0.7 0 - 0.9   POC MID % 10.3 0 - 12 %M   POC Granulocyte 4.1 2 - 6.9   Granulocyte percent 63.7 37 - 80 %G   RBC 4.41 (A) 4.69 - 6.13 M/uL   Hemoglobin 13.6 (A) 14.1 - 18.1 g/dL   HCT, POC 16.137.7 (A) 09.643.5 - 53.7 %   MCV 85.5 80 - 97 fL   MCH, POC 30.8 27 - 31.2 pg   MCHC 36.0 (A) 31.8 - 35.4 g/dL   RDW, POC 04.513.7 %   Platelet Count, POC 127 (A) 142 - 424 K/uL   MPV 6.8 0 - 99.8 fL  POCT glucose (manual entry)  Result Value Ref Range   POC Glucose 298 (A) 70 - 99 mg/dl   Chest x-ray shows some linear densities consistent with atelectasis or bronchitis.  Assessment: Healing palm infection, influenza with respiratory component.  I believe the patient needs to be started on Tamiflu and to watch his blood sugar carefully.      ICD-9-CM ICD-10-CM   1. Blister of hand with infection, right, subsequent encounter V58.89 S60.521D POCT CBC   914.3 L08.9   2. Cough 786.2 R05 oseltamivir  (TAMIFLU) 75 MG capsule  3. Fever, unspecified fever cause 780.60 R50.9 DG Chest 2 View     POCT CBC     POCT glucose (manual entry)     oseltamivir (TAMIFLU) 75 MG capsule  4. Controlled type 2 diabetes mellitus with diabetic polyneuropathy, without long-term current use of insulin (HCC) 250.60 E11.42    357.2       Signed, Elvina SidleKurt Paxtyn Boyar, MD

## 2015-11-06 NOTE — Telephone Encounter (Signed)
Medications Ordered This Encounter       Disp Refills Start End    oseltamivir (TAMIFLU) 75 MG capsule 10 capsule 0 11/05/2015     Take 1 capsule (75 mg total) by mouth 2 (two) times daily. - Oral

## 2015-11-06 NOTE — Telephone Encounter (Signed)
There is no other medicine.

## 2015-11-07 NOTE — Telephone Encounter (Signed)
Pt advised.

## 2015-11-07 NOTE — Telephone Encounter (Signed)
SPoke with pt, advised message. Pt paid for medication.

## 2015-11-08 ENCOUNTER — Encounter: Payer: Self-pay | Admitting: Family Medicine

## 2015-11-08 ENCOUNTER — Ambulatory Visit (INDEPENDENT_AMBULATORY_CARE_PROVIDER_SITE_OTHER): Payer: Medicare Other | Admitting: Family Medicine

## 2015-11-08 VITALS — BP 110/70 | HR 86 | Temp 99.3°F | Resp 16

## 2015-11-08 DIAGNOSIS — S60551D Superficial foreign body of right hand, subsequent encounter: Secondary | ICD-10-CM

## 2015-11-08 DIAGNOSIS — L989 Disorder of the skin and subcutaneous tissue, unspecified: Secondary | ICD-10-CM

## 2015-11-08 MED ORDER — MUPIROCIN CALCIUM 2 % EX CREA: 1.0000 "application " | TOPICAL_CREAM | Freq: Two times a day (BID) | CUTANEOUS | Status: DC

## 2015-11-08 NOTE — Progress Notes (Signed)
Patient ID: [name] MRN: [id number], DOB: 25-Sep-1955, 60 y.o. Date of Encounter: 11/08/2015, 4:05 PM  By signing my name below, I, Judithe Modest, attest that this documentation has been prepared under the direction and in the presence of Robyn Haber, MD. Electronically Signed: Judithe Modest, ER Scribe. 11/08/2015. 4:05 PM.  Primary Physician: Robyn Haber, MD  Chief Complaint  Patient presents with  . Follow-up    Abcess right hand    HPI: 60 y.o. year old male who presents for a follow up due to a right hand abscess. He reported on 11/02/15 complaining of an abscess developing at the site of a puncture wound on his right hand. The puncture occurred one week before. He states that he feels that there is still something under the skin in his hand. He denies increased pain or swelling. His hand seems to be healing normally, other than the feeling that there is something under the skin.   Past Medical History  Diagnosis Date  . Cirrhosis ([place])   . Depression   . Diabetes mellitus without complication ([place])   . Foot pain   . Anxiety   . Hypertension      Home Meds: Prior to Admission medications   Medication Sig Start Date End Date Taking? Authorizing Provider  Alcohol Swabs PADS Check blood sugar daily 10/25/13  Yes Robyn Haber, MD  Blood Glucose Monitoring Suppl (BLOOD GLUCOSE METER) kit Check blood sugar daily 10/25/13  Yes Robyn Haber, MD  diazepam (VALIUM) 10 MG tablet Take 1 tablet (10 mg total) by mouth at bedtime as needed for anxiety. 08/20/15  Yes Robyn Haber, MD  doxycycline (VIBRAMYCIN) 100 MG capsule Take 1 capsule (100 mg total) by mouth 2 (two) times daily. 11/02/15  Yes Tereasa Coop, PA-C  glucose blood (CHOICE DM FORA G20 TEST STRIPS) test strip To check blood sugar three times daily. Freestyle test strips/ dx code 250.00 06/15/12  Yes Robyn Haber, MD  glucose blood test strip Check blood sugar daily 07/18/15  Yes Robyn Haber, MD    HYDROcodone-homatropine [facility]) 5-1.5 MG/5ML syrup Take 5 mLs by mouth every 8 (eight) hours as needed for cough. 11/05/15  Yes Robyn Haber, MD  Lancets MISC Check blood sugar daily. 10/25/13  Yes Robyn Haber, MD  Multiple Vitamin (MULTIVITAMIN) tablet Take 1 tablet by mouth daily.   Yes Historical Provider, MD  nadolol (CORGARD) 20 MG tablet Take 1 tablet (20 mg total) by mouth daily. 10/12/14  Yes Robyn Haber, MD  oseltamivir (TAMIFLU) 75 MG capsule Take 1 capsule (75 mg total) by mouth 2 (two) times daily. 11/05/15  Yes Robyn Haber, MD  oxyCODONE (ROXICODONE) 15 MG immediate release tablet Take 1 tablet (15 mg total) by mouth 4 (four) times daily. 08/07/14  Yes Wendie Agreste, MD  spironolactone (ALDACTONE) 50 MG tablet Take 1 tablet (50 mg total) by mouth daily. 11/26/14  Yes Robyn Haber, MD  thiamine (VITAMIN B-1) 100 MG tablet Take 100 mg by mouth daily.   Yes Historical Provider, MD  UNABLE TO [place]. Check blood sugar daily 10/25/13  Yes Robyn Haber, MD    Allergies:  Allergies  Allergen Reactions  . Cephalexin     Social History   Social History  . Marital Status: Married    Spouse Name: N/A  . Number of Children: N/A  . Years of Education: N/A   Occupational History  . Not on file.   Social History Main Topics  .  Smoking status: Current Every Day Smoker -- 0.30 packs/day for 46 years    Types: Cigarettes    Last Attempt to Quit: 03/24/2011  . Smokeless tobacco: Not on file  . Alcohol Use: No  . Drug Use: No  . Sexual Activity: Yes    Birth Control/ Protection: None   Other Topics Concern  . Not on file   Social History Narrative     Review of Systems: Constitutional: negative for chills, fever, night sweats, weight changes, or fatigue  HEENT: negative for vision changes, hearing loss, congestion, rhinorrhea, ST, epistaxis, or sinus pressure Cardiovascular: negative for chest pain or  palpitations Respiratory: negative for hemoptysis, wheezing, shortness of breath, or cough Abdominal: negative for abdominal pain, nausea, vomiting, diarrhea, or constipation Dermatological: negative for rash Neurologic: negative for headache, dizziness, or syncope All other systems reviewed and are otherwise negative with the exception to those above and in the HPI.   Physical Exam: Blood pressure 110/70, pulse 86, temperature 99.3 F (37.4 C), temperature source Oral, resp. rate 16, SpO2 92 %., There is no weight on file to calculate BMI. General: Well developed, well nourished, in no acute distress. Head: Normocephalic, atraumatic, eyes without discharge, sclera non-icteric, nares are without discharge. Bilateral auditory canals clear, TM's are without perforation, pearly grey and translucent with reflective cone of light bilaterally. Oral cavity moist, posterior pharynx without exudate, erythema, peritonsillar abscess, or post nasal drip.  Neck: Supple. No thyromegaly. Full ROM. No lymphadenopathy. Lungs: Clear bilaterally to auscultation without wheezes, rales, or rhonchi. Breathing is unlabored. Heart: RRR with S1 S2. No murmurs, rubs, or gallops appreciated. Abdomen: Soft, non-tender, non-distended with normoactive bowel sounds. No hepatomegaly. No rebound/guarding. No obvious abdominal masses. Msk:  Strength and tone normal for age. Extremities/Skin: Warm and dry. No clubbing or cyanosis. No edema. No rashes or suspicious lesions. Neuro: Alert and oriented X 3. Moves all extremities spontaneously. Gait is normal. CNII-XII grossly in tact. Psych:  Responds to questions appropriately with a normal affect.   Area was prepped with betadine. Anesthetized with marcaine 0.67m. The foreign body was palpated, #11 blade was used to excise the foreign body. 3/8 inch thick splinter was removed.      ASSESSMENT AND PLAN:  60y.o. year old male with  Skin lesion - Plan: mupirocin cream  (BACTROBAN) 2 %  Foreign body of right hand, subsequent encounter - Plan: mupirocin cream (BACTROBAN) 2 %    Signed, KRobyn Haber MD 11/08/2015 4:05 PM

## 2015-11-08 NOTE — Patient Instructions (Signed)
     IF you received an x-ray today, you will receive an invoice from [facility]. Please contact [facility] at [phone] with questions or concerns regarding your invoice.   IF you received labwork today, you will receive an invoice from [facility]. Please contact [facility] at [phone] with questions or concerns regarding your invoice.   Our billing staff will not be able to assist you with questions regarding bills from these companies.  You will be contacted with the lab results as soon as they are available. The fastest way to get your results is to activate your My Chart account. Instructions are located on the last page of this paperwork. If you have not heard from us regarding the results in 2 weeks, please contact this office.      

## 2015-11-19 ENCOUNTER — Telehealth: Payer: Self-pay

## 2015-11-19 NOTE — Telephone Encounter (Signed)
Bonita QuinLinda called for her and her husband.  Due to Dr. Elbert EwingsL retiring they both need letters showing that.  Their new provider is requiring the letters.  I see one was done for her and is pending on 11-18-15, but I do not see that one has been done for him.  Please call them when these letters are ready to pick up.  [phone]

## 2015-11-20 NOTE — Telephone Encounter (Signed)
Left message for pt to call back  °

## 2015-11-21 ENCOUNTER — Other Ambulatory Visit: Payer: Self-pay

## 2015-11-21 DIAGNOSIS — R195 Other fecal abnormalities: Secondary | ICD-10-CM

## 2015-11-21 NOTE — Telephone Encounter (Signed)
Ok done

## 2016-01-08 LAB — IFOBT (OCCULT BLOOD): IFOBT: POSITIVE

## 2016-01-21 ENCOUNTER — Encounter: Payer: Self-pay | Admitting: Internal Medicine

## 2016-01-21 NOTE — Progress Notes (Unsigned)
[employer] FOBT from 5/17 positive(Dr L ordered) Needs GI evaluation and referral made

## 2016-01-23 ENCOUNTER — Ambulatory Visit (INDEPENDENT_AMBULATORY_CARE_PROVIDER_SITE_OTHER): Payer: Medicare Other | Admitting: Family Medicine

## 2016-01-23 ENCOUNTER — Telehealth: Payer: Self-pay

## 2016-01-23 VITALS — BP 120/78 | HR 58 | Temp 97.7°F | Resp 16 | Wt 253.0 lb

## 2016-01-23 DIAGNOSIS — L97511 Non-pressure chronic ulcer of other part of right foot limited to breakdown of skin: Secondary | ICD-10-CM

## 2016-01-23 DIAGNOSIS — S46212A Strain of muscle, fascia and tendon of other parts of biceps, left arm, initial encounter: Secondary | ICD-10-CM

## 2016-01-23 DIAGNOSIS — S46112A Strain of muscle, fascia and tendon of long head of biceps, left arm, initial encounter: Secondary | ICD-10-CM | POA: Diagnosis not present

## 2016-01-23 DIAGNOSIS — G4700 Insomnia, unspecified: Secondary | ICD-10-CM | POA: Diagnosis not present

## 2016-01-23 MED ORDER — DOXYCYCLINE HYCLATE 100 MG PO CAPS: 100.0000 mg | ORAL_CAPSULE | Freq: Two times a day (BID) | ORAL | Status: DC

## 2016-01-23 MED ORDER — DIAZEPAM 10 MG PO TABS: 10.0000 mg | ORAL_TABLET | Freq: Every evening | ORAL | Status: DC | PRN

## 2016-01-23 NOTE — Patient Instructions (Signed)
     IF you received an x-ray today, you will receive an invoice from [facility]. Please contact [facility] at [phone] with questions or concerns regarding your invoice.   IF you received labwork today, you will receive an invoice from [facility]. Please contact [facility] at [phone] with questions or concerns regarding your invoice.   Our billing staff will not be able to assist you with questions regarding bills from these companies.  You will be contacted with the lab results as soon as they are available. The fastest way to get your results is to activate your My Chart account. Instructions are located on the last page of this paperwork. If you have not heard from us regarding the results in 2 weeks, please contact this office.      

## 2016-01-23 NOTE — Progress Notes (Signed)
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  He has several problems today  1. He is having discharge from the stump of the left foot at the site of the distal callus.  There is no pain, no fever 2. He is needing a refill on his Valium for anxiety which he has taken for years.  No new issues or anxiety 3. Four days of ecchymosis on left arm from upper arm to wrist, circumferential, without pain after lifting heavy wood.  No weakness or tenderness.    Objective:  BP 120/78 mmHg  Pulse 58  Temp(Src) 97.7 F (36.5 C) (Oral)  Resp 16  Wt 253 lb (114.76 kg)  SpO2 96% Small focus of discharge left foot stump at site of callus without erythema or tenderness. No significant nervousness Marked violaceous ecchymosis of left arm from 3 inches below the [facility] to the wrist with mild swelling, no tenderness, FROM, and no heat.  Assessment:  Chronically disabled man with diabetes and forefoot amputations who appears to have a new area of infection on the left stump. Chronic anxiety with no loss of effectiveness of valium Biceps muscle strain with resulting ecchymosis.  Plan: Biceps muscle strain, left, initial encounter  Right foot ulcer, limited to breakdown of skin (HCC) - Plan: AMB referral to wound care center, doxycycline (VIBRAMYCIN) 100 MG capsule  Insomnia - Plan: diazepam (VALIUM) 10 MG tablet  Elvina SidleKurt Karsen Fellows, MD

## 2016-01-23 NOTE — Telephone Encounter (Signed)
MLOVM from [facility] requested the last three OV notes on patient.  Stated Dr. Elbert EwingsL referred him over, I did not see a referral since 2014 for Dr. Elbert EwingsL.  LMOVM for patient to complete ROI in order to fulfull this request.   [phone] Fax number for [facility]

## 2016-02-03 ENCOUNTER — Encounter: Payer: Self-pay | Admitting: Family Medicine

## 2016-02-06 ENCOUNTER — Other Ambulatory Visit: Payer: Self-pay | Admitting: Family Medicine

## 2016-02-07 ENCOUNTER — Ambulatory Visit (INDEPENDENT_AMBULATORY_CARE_PROVIDER_SITE_OTHER): Payer: Medicare Other | Admitting: Family Medicine

## 2016-02-07 VITALS — BP 120/78 | HR 57 | Temp 97.9°F | Resp 18 | Ht 72.05 in | Wt 255.0 lb

## 2016-02-07 DIAGNOSIS — Z899 Acquired absence of limb, unspecified: Secondary | ICD-10-CM | POA: Diagnosis not present

## 2016-02-07 DIAGNOSIS — M79671 Pain in right foot: Secondary | ICD-10-CM | POA: Diagnosis not present

## 2016-02-07 DIAGNOSIS — L97511 Non-pressure chronic ulcer of other part of right foot limited to breakdown of skin: Secondary | ICD-10-CM | POA: Diagnosis not present

## 2016-02-07 MED ORDER — DOXYCYCLINE HYCLATE 100 MG PO CAPS: 100.0000 mg | ORAL_CAPSULE | Freq: Two times a day (BID) | ORAL | Status: DC

## 2016-02-07 NOTE — Progress Notes (Signed)
Subjective:  By signing my name below, I, Raven Small, attest that this documentation has been prepared under the direction and in the presence of Merri Ray, MD.  Electronically Signed: Thea Alken, ED Scribe. 02/07/2016. 11:18 AM.   Patient ID: [name], male    DOB: 1955/09/01, 60 y.o.   MRN: [id number]  HPI Chief Complaint  Patient presents with  . Foot Pain    right foot   . Medication Refill    doxycycline     HPI Comments: [name] is a 60 y.o. male who presents to the [facility] for foot wound. Last seen here 15 days ago with foot ulcer. hx of Diabetes, alcohol abuse, cirrhosis, CVA with right sided weakness, chronic pain and diabetic neuropathy with previous great toe amputation. Noted to have discharge at stump of left foot without erythema or tenderness at last visit. Referred to wound care center started on doxycycline. Appears referral was sent 6/15. 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 Initial amputation in 2013. Revision was due to a chonic non healing ulcer at revision site. Based on 09/01/14 note from   Pt has continued to have drainage from wound. He ran out of doxycyline 2-3 days ago but reports unchanged symptoms while on medication. He's had pain with walking and redness around wound for the past 2-3 days. He has been applying neosporin.  He has an appointment with wound care next week. Pt plans to schedule an appointment with his podiatrist Dr. Prudy Feeler today .   Patient Active Problem List   Diagnosis Date Noted  . Diabetes type 2, uncontrolled ([place]) 09/24/2011  . Hypertension 09/24/2011  . Cirrhosis ([place]) 09/24/2011  . CVA (cerebral infarction) 09/24/2011   Past Medical History  Diagnosis Date  . Cirrhosis ([place])   . Depression   . Diabetes mellitus without complication ([place])   . Foot pain   . Anxiety   . Hypertension    No past surgical history on file. Allergies  Allergen Reactions  . Cephalexin    Prior  to Admission medications   Medication Sig Start Date End Date Taking? Authorizing Provider  Alcohol Swabs PADS Check blood sugar daily 10/25/13  Yes Robyn Haber, MD  Blood Glucose Monitoring Suppl (BLOOD GLUCOSE METER) kit Check blood sugar daily 10/25/13  Yes Robyn Haber, MD  diazepam (VALIUM) 10 MG tablet Take 1 tablet (10 mg total) by mouth at bedtime as needed for anxiety. 01/23/16  Yes Robyn Haber, MD  doxycycline (VIBRAMYCIN) 100 MG capsule Take 1 capsule (100 mg total) by mouth 2 (two) times daily. 01/23/16  Yes Robyn Haber, MD  glucose blood (CHOICE DM FORA G20 TEST STRIPS) test strip To check blood sugar three times daily. Freestyle test strips/ dx code 250.00 06/15/12  Yes Robyn Haber, MD  glucose blood test strip Check blood sugar daily 07/18/15  Yes Robyn Haber, MD  Lancets MISC Check blood sugar daily. 10/25/13  Yes Robyn Haber, MD  Multiple Vitamin (MULTIVITAMIN) tablet Take 1 tablet by mouth daily.   Yes Historical Provider, MD  nadolol (CORGARD) 20 MG tablet Take 1 tablet (20 mg total) by mouth daily. 10/12/14  Yes Robyn Haber, MD  oxyCODONE (ROXICODONE) 15 MG immediate release tablet Take 1 tablet (15 mg total) by mouth 4 (four) times daily. 08/07/14  Yes Wendie Agreste, MD  spironolactone (ALDACTONE) 50 MG tablet Take 1 tablet (50 mg total) by mouth daily. 11/26/14  Yes Robyn Haber, MD  thiamine (VITAMIN B-1)  100 MG tablet Take 100 mg by mouth daily.   Yes Historical Provider, MD  UNABLE TO [place]. Check blood sugar daily 10/25/13  Yes Robyn Haber, MD  HYDROcodone-homatropine [facility]) 5-1.5 MG/5ML syrup Take 5 mLs by mouth every 8 (eight) hours as needed for cough. Patient not taking: Reported on 01/23/2016 11/05/15   Robyn Haber, MD   Social History   Social History  . Marital Status: Married    Spouse Name: N/A  . Number of Children: N/A  . Years of Education: N/A   Occupational History  . Not on file.    Social History Main Topics  . Smoking status: Current Every Day Smoker -- 0.30 packs/day for 46 years    Types: Cigarettes    Last Attempt to Quit: 03/24/2011  . Smokeless tobacco: Not on file  . Alcohol Use: No  . Drug Use: No  . Sexual Activity: Yes    Birth Control/ Protection: None   Other Topics Concern  . Not on file   Social History Narrative   Review of Systems  Skin: Positive for color change and wound.   Objective:   Physical Exam  Constitutional: He is oriented to person, place, and time. He appears well-developed and well-nourished. No distress.  HENT:  Head: Normocephalic and atraumatic.  Eyes: Conjunctivae and EOM are normal.  Neck: Neck supple.  Cardiovascular: Normal rate.   Pulmonary/Chest: Effort normal.  Musculoskeletal: Normal range of motion.  Neurological: He is alert and oriented to person, place, and time.  Skin: Skin is warm and dry.  Cap refill at the previous scar from amputation is 1 sec. erythema approximately 1 cm out from callous. Tender along plantar bone. There is a wound with dry blood. Wound is approximately 1.5 cm across. Unable to visualize the base of wound without overlying skin. No exudate.   Psychiatric: He has a normal mood and affect. His behavior is normal.  Nursing note and vitals reviewed.  Filed Vitals:   02/07/16 1024  BP: 120/78  Pulse: 57  Temp: 97.9 F (36.6 C)  TempSrc: Oral  Resp: 18  Height: 6' 0.05" (1.83 m)  Weight: 255 lb (115.667 kg)  SpO2: 98%    Assessment & Plan:   [name] is a 60 y.o. male Right foot ulcer, limited to breakdown of skin ([place]) - Plan: doxycycline (VIBRAMYCIN) 100 MG capsule  History of amputation  Right foot pain History of transmetatarsal amputation with revision last year. Now with ulcer and similar area based on previous notes for the past approximately 3-4 weeks. Treatment with doxycycline 2 weeks ago, with some subjective discomfort warmth past 3-4 days. Also has some  tenderness along the distal bony prominence near the amputation. I do not see any discharge from the wound, but unable to completely visualize past the skin, so not sure if to fat or muscle. Discussed concern for possible osteomyelitis with bony tenderness and recommended MRI, but he declined that study today. He would like to discuss his care with his foot specialist, and just wants refill of doxycycline. I discussed that if it is osteomyelitis, doxycycline alone would not likely be sufficient treatment. Understanding expressed.  - I tried calling his podiatrist, but he was in surgery and plan for call back. In the meantime, covered with bandage, wound care discussed, and refilled doxycycline.   - ER precautions given if any increased pain, redness, or discharge. Also advised if he changes his mind about the MRI, to return  here or the emergency room.  Meds ordered this encounter  Medications  . doxycycline (VIBRAMYCIN) 100 MG capsule    Sig: Take 1 capsule (100 mg total) by mouth 2 (two) times daily.    Dispense:  20 capsule    Refill:  0   Patient Instructions       IF you received an x-ray today, you will receive an invoice from [facility]. Please contact [facility] at [phone] with questions or concerns regarding your invoice.   IF you received labwork today, you will receive an invoice from [employer]. Please contact [facility] at [phone] with questions or concerns regarding your invoice.   Our billing staff will not be able to assist you with questions regarding bills from these companies.  You will be contacted with the lab results as soon as they are available. The fastest way to get your results is to activate your My Chart account. Instructions are located on the last page of this paperwork. If you have not heard from [place] regarding the results in 2 weeks, please contact this office.     I tried to call your foot specialist but  he is currently in surgery. I'm expecting a phone call back, but as we discussed, I'm worried about a possible infection deeper into your foot or possible bone infection (Osteomyelitis). I would recommend an MRI today to look into this, but as you have declined this study at this point, we can try to coordinate your care with your foot specialist. I will refill the doxycycline for now, but again as we discussed, if you have an infection of bone, this would not be sufficient treatment, and your symptoms could worsen, including possible repeat surgery or injury of your foot.    If you have any worsening of symptoms over the weekend, proceed to the emergency room. For now keep wound clean and covered, clean with soap and water once or twice per day, cover with sterile bandage.      I personally performed the services described in this documentation, which was scribed in my presence. The recorded information has been reviewed and considered, and addended by me as needed.   Signed,   Merri Ray, MD [facility].  02/07/2016 12:09 PM

## 2016-02-07 NOTE — Patient Instructions (Addendum)
     IF you received an x-ray today, you will receive an invoice from [facility]. Please contact [facility] at [phone] with questions or concerns regarding your invoice.   IF you received labwork today, you will receive an invoice from [facility]. Please contact [facility] at [phone] with questions or concerns regarding your invoice.   Our billing staff will not be able to assist you with questions regarding bills from these companies.  You will be contacted with the lab results as soon as they are available. The fastest way to get your results is to activate your My Chart account. Instructions are located on the last page of this paperwork. If you have not heard from us regarding the results in 2 weeks, please contact this office.     I tried to call your foot specialist but he is currently in surgery. I'm expecting a phone call back, but as we discussed, I'm worried about a possible infection deeper into your foot or possible bone infection (Osteomyelitis). I would recommend an MRI today to look into this, but as you have declined this study at this point, we can try to coordinate your care with your foot specialist. I will refill the doxycycline for now, but again as we discussed, if you have an infection of bone, this would not be sufficient treatment, and your symptoms could worsen, including possible repeat surgery or injury of your foot.    If you have any worsening of symptoms over the weekend, proceed to the emergency room. For now keep wound clean and covered, clean with soap and water once or twice per day, cover with sterile bandage.

## 2016-02-14 ENCOUNTER — Telehealth: Payer: Self-pay

## 2016-02-14 ENCOUNTER — Encounter (HOSPITAL_BASED_OUTPATIENT_CLINIC_OR_DEPARTMENT_OTHER): Payer: Medicare Other | Attending: Internal Medicine

## 2016-02-14 DIAGNOSIS — E1140 Type 2 diabetes mellitus with diabetic neuropathy, unspecified: Secondary | ICD-10-CM | POA: Insufficient documentation

## 2016-02-14 DIAGNOSIS — I10 Essential (primary) hypertension: Secondary | ICD-10-CM | POA: Diagnosis not present

## 2016-02-14 DIAGNOSIS — Z89431 Acquired absence of right foot: Secondary | ICD-10-CM | POA: Insufficient documentation

## 2016-02-14 DIAGNOSIS — Z89422 Acquired absence of other left toe(s): Secondary | ICD-10-CM | POA: Insufficient documentation

## 2016-02-14 DIAGNOSIS — L97411 Non-pressure chronic ulcer of right heel and midfoot limited to breakdown of skin: Secondary | ICD-10-CM | POA: Diagnosis not present

## 2016-02-14 DIAGNOSIS — Z87898 Personal history of other specified conditions: Secondary | ICD-10-CM | POA: Insufficient documentation

## 2016-02-14 DIAGNOSIS — F17200 Nicotine dependence, unspecified, uncomplicated: Secondary | ICD-10-CM | POA: Insufficient documentation

## 2016-02-14 DIAGNOSIS — E11621 Type 2 diabetes mellitus with foot ulcer: Secondary | ICD-10-CM | POA: Insufficient documentation

## 2016-02-14 NOTE — Telephone Encounter (Signed)
I am not sure what he is referring to?   HPI Comments: [name] is a 60 y.o. male who presents to the [facility] for foot wound. Last seen here 15 days ago with foot ulcer. hx of Diabetes, alcohol abuse, cirrhosis, CVA with right sided weakness, chronic pain and diabetic neuropathy with previous great toe amputation. Noted to have discharge at stump of left foot without erythema or tenderness at last visit. Referred to wound care center started on doxycycline. Appears referral was sent 6/15. 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 Initial amputation in 2013. Revision was due to a chonic non healing ulcer at revision site. Based on 09/01/14 note from   Spoke with pt, he states he was at the wound center and the doctor asked about him having a stroke in the past. He states he has never had a stroke and would like for it to be removed. Pt seemed calm and cooperative. FYI Dr. Neva SeatGreene

## 2016-02-14 NOTE — Telephone Encounter (Signed)
Pt saw in his chart that Dr. Neva SeatGreene stated he has had a stroke in the past. He was very unhappy. Pt wants this corrected or he is gonna come up here. Please advise at [phone]

## 2016-02-15 NOTE — Telephone Encounter (Signed)
After reviewing paper chart. Dr Milus GlazierLauenstein put this diagnosis in back in 2013, not Dr. Neva SeatGreene.

## 2016-02-17 ENCOUNTER — Encounter: Payer: Self-pay | Admitting: Family Medicine

## 2016-02-19 ENCOUNTER — Ambulatory Visit (HOSPITAL_COMMUNITY)
Admission: RE | Admit: 2016-02-19 | Discharge: 2016-02-19 | Disposition: A | Discharge status: Home / Self Care | Payer: Medicare Other | Source: Ambulatory Visit | Attending: Internal Medicine | Admitting: Internal Medicine

## 2016-02-19 ENCOUNTER — Other Ambulatory Visit: Payer: Self-pay | Admitting: Internal Medicine

## 2016-02-19 DIAGNOSIS — M869 Osteomyelitis, unspecified: Secondary | ICD-10-CM

## 2016-02-19 DIAGNOSIS — M7989 Other specified soft tissue disorders: Secondary | ICD-10-CM | POA: Diagnosis not present

## 2016-02-19 DIAGNOSIS — L97511 Non-pressure chronic ulcer of other part of right foot limited to breakdown of skin: Secondary | ICD-10-CM | POA: Insufficient documentation

## 2016-02-19 DIAGNOSIS — Z89421 Acquired absence of other right toe(s): Secondary | ICD-10-CM | POA: Insufficient documentation

## 2016-02-19 DIAGNOSIS — E11621 Type 2 diabetes mellitus with foot ulcer: Secondary | ICD-10-CM | POA: Diagnosis not present

## 2016-02-21 DIAGNOSIS — E11621 Type 2 diabetes mellitus with foot ulcer: Secondary | ICD-10-CM | POA: Diagnosis not present

## 2016-02-28 DIAGNOSIS — E11621 Type 2 diabetes mellitus with foot ulcer: Secondary | ICD-10-CM | POA: Diagnosis not present

## 2016-03-06 DIAGNOSIS — E11621 Type 2 diabetes mellitus with foot ulcer: Secondary | ICD-10-CM | POA: Diagnosis not present

## 2016-03-13 ENCOUNTER — Encounter (HOSPITAL_BASED_OUTPATIENT_CLINIC_OR_DEPARTMENT_OTHER): Payer: Medicare Other | Attending: Internal Medicine

## 2016-03-13 DIAGNOSIS — E1142 Type 2 diabetes mellitus with diabetic polyneuropathy: Secondary | ICD-10-CM | POA: Diagnosis not present

## 2016-03-13 DIAGNOSIS — L84 Corns and callosities: Secondary | ICD-10-CM | POA: Insufficient documentation

## 2016-03-13 DIAGNOSIS — I10 Essential (primary) hypertension: Secondary | ICD-10-CM | POA: Diagnosis not present

## 2016-03-13 DIAGNOSIS — K7460 Unspecified cirrhosis of liver: Secondary | ICD-10-CM | POA: Insufficient documentation

## 2016-03-13 DIAGNOSIS — E11621 Type 2 diabetes mellitus with foot ulcer: Secondary | ICD-10-CM | POA: Diagnosis present

## 2016-03-13 DIAGNOSIS — L97512 Non-pressure chronic ulcer of other part of right foot with fat layer exposed: Secondary | ICD-10-CM | POA: Diagnosis not present

## 2016-03-16 DIAGNOSIS — E11621 Type 2 diabetes mellitus with foot ulcer: Secondary | ICD-10-CM | POA: Diagnosis not present

## 2016-03-23 DIAGNOSIS — E11621 Type 2 diabetes mellitus with foot ulcer: Secondary | ICD-10-CM | POA: Diagnosis not present

## 2016-03-30 DIAGNOSIS — E11621 Type 2 diabetes mellitus with foot ulcer: Secondary | ICD-10-CM | POA: Diagnosis not present

## 2016-04-06 DIAGNOSIS — E11621 Type 2 diabetes mellitus with foot ulcer: Secondary | ICD-10-CM | POA: Diagnosis not present

## 2016-04-16 ENCOUNTER — Encounter (HOSPITAL_BASED_OUTPATIENT_CLINIC_OR_DEPARTMENT_OTHER): Payer: Medicare Other | Attending: Internal Medicine

## 2016-04-16 DIAGNOSIS — E11621 Type 2 diabetes mellitus with foot ulcer: Secondary | ICD-10-CM | POA: Diagnosis not present

## 2016-04-16 DIAGNOSIS — K7460 Unspecified cirrhosis of liver: Secondary | ICD-10-CM | POA: Diagnosis not present

## 2016-04-16 DIAGNOSIS — L84 Corns and callosities: Secondary | ICD-10-CM | POA: Insufficient documentation

## 2016-04-16 DIAGNOSIS — L97511 Non-pressure chronic ulcer of other part of right foot limited to breakdown of skin: Secondary | ICD-10-CM | POA: Diagnosis not present

## 2016-04-16 DIAGNOSIS — I10 Essential (primary) hypertension: Secondary | ICD-10-CM | POA: Diagnosis not present

## 2016-04-16 DIAGNOSIS — E1142 Type 2 diabetes mellitus with diabetic polyneuropathy: Secondary | ICD-10-CM | POA: Diagnosis not present

## 2016-04-17 DIAGNOSIS — E11621 Type 2 diabetes mellitus with foot ulcer: Secondary | ICD-10-CM | POA: Diagnosis not present

## 2016-04-23 ENCOUNTER — Emergency Department (HOSPITAL_COMMUNITY)
Admission: EM | Admit: 2016-04-23 | Discharge: 2016-04-23 | Discharge status: Against Medical Advice | Payer: Medicare Other

## 2016-04-23 DIAGNOSIS — E11621 Type 2 diabetes mellitus with foot ulcer: Secondary | ICD-10-CM | POA: Diagnosis not present

## 2016-04-28 ENCOUNTER — Telehealth: Payer: Self-pay

## 2016-04-28 NOTE — Telephone Encounter (Signed)
Will the orthopedic surgeon not be able to do this as they know what is specifically needed.

## 2016-04-28 NOTE — Telephone Encounter (Signed)
Patient is admitted at [facility]. Patient need a Primary Care doctor. Patient is a previous Dr. Milus GlazierLauenstein patient. Patient had his foot amputated.  Doctors at the hospital are requesting home health and Rehab for patient. Please call wife at [phone].

## 2016-05-02 NOTE — Telephone Encounter (Signed)
I have called wife to make sure Ortho has taken care of this for him, this is a service Ortho does provide after surgery. Left message for call back to let us know

## 2016-05-04 NOTE — Telephone Encounter (Signed)
Pt still in hospital is [lanning on going to [facility].

## 2016-05-05 NOTE — Telephone Encounter (Signed)
Great - I am glad that he has been taken care of

## 2016-05-14 ENCOUNTER — Telehealth: Payer: Self-pay | Admitting: Emergency Medicine

## 2016-05-14 ENCOUNTER — Other Ambulatory Visit: Payer: Self-pay | Admitting: Emergency Medicine

## 2016-05-14 ENCOUNTER — Telehealth: Payer: Self-pay

## 2016-05-14 MED ORDER — LANCETS MISC: 1 refills | Status: DC

## 2016-05-14 MED ORDER — UNABLE TO FIND: 1 refills | Status: AC

## 2016-05-14 NOTE — Telephone Encounter (Signed)
PATIENT'S WIFE (LINDA) STATES HER HUSBAND WAS DR. Loma BostonLAUENSTEIN'S PATIENT. HE NEEDS TO GET REFILLS ON HIS ACC-CHEK ROUND LANCETS (FAST CLIX) AND ACC-CHEK SMARTVIEW CONTROL SOLUTION. PLEASE SEND TO [facility]. [facility] PHONE IS [phone] BE[facility] PHONE [phone] (WIFE'S NAME IS LINDA Molinelli) MBC

## 2016-05-14 NOTE — Telephone Encounter (Signed)
Medication refilled and [facility]

## 2016-05-19 MED ORDER — LANCETS MISC: 1 refills | Status: DC

## 2016-05-20 MED ORDER — LANCETS MISC: 1 refills | Status: DC

## 2016-05-20 NOTE — Telephone Encounter (Signed)
Got a message to resend rx for lancet with  An MD as prescriber due to insurance. Done.

## 2016-05-21 NOTE — Telephone Encounter (Signed)
Needs test strips and lancets from [facility] mail service   [phone]

## 2016-05-21 NOTE — Telephone Encounter (Signed)
done

## 2016-06-04 ENCOUNTER — Other Ambulatory Visit: Payer: Self-pay

## 2016-06-04 DIAGNOSIS — E1142 Type 2 diabetes mellitus with diabetic polyneuropathy: Secondary | ICD-10-CM

## 2016-06-04 MED ORDER — GLUCOSE BLOOD VI STRP: ORAL_STRIP | 0 refills | Status: DC

## 2016-06-04 NOTE — Telephone Encounter (Signed)
NEEDS OV 

## 2016-06-12 ENCOUNTER — Telehealth: Payer: Self-pay | Admitting: *Deleted

## 2016-06-12 NOTE — Telephone Encounter (Signed)
[facility] sent over fax for PT.  Advised them that patient needs to establish care with new PCP or for them to contact the ortho that did his amputation.  They stated they will contact patient

## 2016-07-23 ENCOUNTER — Other Ambulatory Visit: Payer: Self-pay | Admitting: Family Medicine

## 2016-07-23 DIAGNOSIS — E1142 Type 2 diabetes mellitus with diabetic polyneuropathy: Secondary | ICD-10-CM

## 2016-08-06 ENCOUNTER — Other Ambulatory Visit: Payer: Self-pay | Admitting: Family Medicine

## 2016-08-06 DIAGNOSIS — G4700 Insomnia, unspecified: Secondary | ICD-10-CM

## 2016-08-06 MED ORDER — DIAZEPAM 10 MG PO TABS: 10.0000 mg | ORAL_TABLET | Freq: Every evening | ORAL | 0 refills | Status: DC | PRN

## 2016-08-06 NOTE — Telephone Encounter (Signed)
Please call in for patient as I am out of the office.

## 2016-08-06 NOTE — Addendum Note (Signed)
Addended by: Morrell RiddleWEBER, SARAH L on: 08/06/2016 03:47 PM   Modules accepted: Orders

## 2016-08-06 NOTE — Telephone Encounter (Signed)
[name], I spoke with patient he understands he needs to come in and establish care with a new provider before any refills he is given run out.

## 2016-08-06 NOTE — Telephone Encounter (Signed)
CALLED TO pharmacy

## 2016-08-11 ENCOUNTER — Telehealth: Payer: Self-pay

## 2016-08-11 DIAGNOSIS — G894 Chronic pain syndrome: Secondary | ICD-10-CM

## 2016-08-11 NOTE — Telephone Encounter (Signed)
Check message I made earlier on 08/12/15 just a moment ago not sure what happened to message.CB linda at [phone]

## 2016-08-11 NOTE — Telephone Encounter (Signed)
Pt has broken his wheelchair. He needs this to go to a doctor's visit coming up 08/13/15. The appointment has been cancelled until he gets this situated. He would like for us to get him a prescription for a wheelchair. Can we help with this? They would like a 28" width basic wheelchair-needs part that holds foot and leg from past amputation-below knee. CB#

## 2016-08-12 NOTE — Telephone Encounter (Signed)
Dr Neva SeatGreene, you saw pt in June and discussed his foot ulcer but no mention of need for wheelchair at that time. Does he need another OV to document need for a new wheelchair/ medical necessity? Looks like pt does have Medicare plan.

## 2016-08-12 NOTE — Telephone Encounter (Signed)
Duplicate

## 2016-08-12 NOTE — Telephone Encounter (Signed)
Okay to provide a prescription for him to have a wheelchair. Please complete whatever specifics may be needed for his insurance, and I'm happy to sign off if needed.

## 2016-08-14 ENCOUNTER — Other Ambulatory Visit: Payer: Self-pay

## 2016-08-14 NOTE — Progress Notes (Unsigned)
whee 

## 2016-08-15 NOTE — Telephone Encounter (Signed)
Advised the order is up front for pick up

## 2016-08-15 NOTE — Addendum Note (Signed)
Addended by: Clarene CritchleyKOLLER, Shabreka Coulon M on: 08/15/2016 04:12 PM   Modules accepted: Orders

## 2016-09-07 LAB — HEPATIC FUNCTION PANEL
ALT: 31 U/L (ref 10–40)
AST: 29 U/L (ref 14–40)
Alkaline Phosphatase: 62 U/L (ref 25–125)

## 2016-09-07 LAB — CBC AND DIFFERENTIAL
HEMATOCRIT: 39 % — AB (ref 41–53)
Hemoglobin: 12.5 g/dL — AB (ref 13.5–17.5)
Platelets: 202 10*3/uL (ref 150–399)
WBC: 7 10^3/mL

## 2016-09-07 LAB — BASIC METABOLIC PANEL
CREATININE: 0.7 mg/dL (ref <=1.3)
GLUCOSE: 77 mg/dL
POTASSIUM: 4.3 mmol/L (ref 3.4–5.3)
Sodium: 142 mmol/L (ref 137–147)

## 2016-09-07 LAB — POCT INR: INR: 1.4 — AB (ref <=1.1)

## 2016-09-08 ENCOUNTER — Ambulatory Visit (INDEPENDENT_AMBULATORY_CARE_PROVIDER_SITE_OTHER): Payer: Medicare Other | Admitting: Physician Assistant

## 2016-09-08 VITALS — BP 128/92 | HR 56 | Temp 97.9°F

## 2016-09-08 DIAGNOSIS — M25511 Pain in right shoulder: Secondary | ICD-10-CM | POA: Diagnosis not present

## 2016-09-08 DIAGNOSIS — Z23 Encounter for immunization: Secondary | ICD-10-CM | POA: Diagnosis not present

## 2016-09-08 DIAGNOSIS — E1165 Type 2 diabetes mellitus with hyperglycemia: Secondary | ICD-10-CM | POA: Diagnosis not present

## 2016-09-08 DIAGNOSIS — E11628 Type 2 diabetes mellitus with other skin complications: Secondary | ICD-10-CM

## 2016-09-08 DIAGNOSIS — G4700 Insomnia, unspecified: Secondary | ICD-10-CM

## 2016-09-08 DIAGNOSIS — Z79899 Other long term (current) drug therapy: Secondary | ICD-10-CM | POA: Diagnosis not present

## 2016-09-08 MED ORDER — DIAZEPAM 10 MG PO TABS: 10.0000 mg | ORAL_TABLET | Freq: Every evening | ORAL | 1 refills | Status: AC | PRN

## 2016-09-08 MED ORDER — LANCETS MISC: 1 refills | Status: DC

## 2016-09-08 MED ORDER — DIAZEPAM 10 MG PO TABS: 10.0000 mg | ORAL_TABLET | Freq: Every evening | ORAL | 1 refills | Status: DC | PRN

## 2016-09-08 MED ORDER — DIAZEPAM 10 MG PO TABS: 10.0000 mg | ORAL_TABLET | Freq: Every evening | ORAL | 0 refills | Status: DC | PRN

## 2016-09-08 MED ORDER — DIAZEPAM 10 MG PO TABS: 10.0000 mg | ORAL_TABLET | Freq: Every evening | ORAL | 0 refills | Status: AC | PRN

## 2016-09-08 NOTE — Patient Instructions (Addendum)
Please schedule an annual exam in three months.  Orthopedics will call you to schedule an appointment for your shoulder injection.   Thank you for coming in today. I hope you feel we met your needs.  Feel free to call [facility] if you have any questions or further requests.  Please consider signing up for MyChart if you do not already have it, as this is a great way to communicate with me.  Best,  Whitney McVey, PA-C    IF you received an x-ray today, you will receive an invoice from [facility]. Please contact [facility] at [phone] with questions or concerns regarding your invoice.   IF you received labwork today, you will receive an invoice from [place]. Please contact [facility] at [phone] with questions or concerns regarding your invoice.   Our billing staff will not be able to assist you with questions regarding bills from these companies.  You will be contacted with the lab results as soon as they are available. The fastest way to get your results is to activate your My Chart account. Instructions are located on the last page of this paperwork. If you have not heard from [place] regarding the results in 2 weeks, please contact this office.

## 2016-09-08 NOTE — Progress Notes (Signed)
[name]  MRN: [id number] DOB: [birth year]  PCP: No primary care provider on file.  Subjective:  Pt is a 61 year old male PMH HTN, stroke, alcohol cirrhosis, diabetes, presents to clinic for medication refill.  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  Former pt of Dr. Joseph Art.  Anxiety - Valium 10 mg at bed time. Every night. Needs refill.   H/o uncontrolled diabetes - Lancets, Needs refill. Home sugars are 89-120. 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    Right shoulder pain - Cortisone shot right shoulder. Gets them every 1-2 years. He would like a shot today.    History of ETOH abuse - half gallon/day plus 18 pack/year history of smoking.   Pain management in [facility] - Oxycodone 36m. Managed by JAlthea Charon  F/u q 2 months.   Review of Systems  Constitutional: Negative for chills and diaphoresis.  Respiratory: Negative for cough, chest tightness, shortness of breath and wheezing.   Cardiovascular: Negative for chest pain and palpitations.  Gastrointestinal: Negative for diarrhea, nausea and vomiting.  Musculoskeletal: Positive for arthralgias (right shoulder).  Neurological: Negative for dizziness, syncope, light-headedness and headaches.  Psychiatric/Behavioral: Positive for sleep disturbance. The patient is nervous/anxious.     Patient Active Problem List   Diagnosis Date Noted  . Alcohol abuse, in remission 08/29/2014  . Chronic pain associated with significant psychosocial dysfunction 08/29/2014  . Type 2 diabetes mellitus with other diabetic neurological complication 068/03/8109 . Esophageal varices ([place] 03/05/2014  . Diabetes type 2, uncontrolled ([place] 09/24/2011  . Hypertension 09/24/2011  . Cirrhosis ([place] 09/24/2011  . Cerebral artery occlusion with cerebral infarction ([place] 09/24/2011  . Alcoholic cirrhosis ([place] 031/59/4585   Current Outpatient Prescriptions on  File Prior to Visit  Medication Sig Dispense Refill  . ACCU-CHEK SMARTVIEW test strip CHECK BLOOD SUGAR DAILY 100 each 1  . Alcohol Swabs PADS Check blood sugar daily 120 each 1  . Blood Glucose Monitoring Suppl (BLOOD GLUCOSE METER) kit Check blood sugar daily 1 each 0  . diazepam (VALIUM) 10 MG tablet Take 1 tablet (10 mg total) by mouth at bedtime as needed for anxiety. 30 tablet 0  . Lancets MISC Check blood sugar daily. 100 each 1  . Multiple Vitamin (MULTIVITAMIN) tablet Take 1 tablet by mouth daily.    . nadolol (CORGARD) 20 MG tablet Take 1 tablet (20 mg total) by mouth daily. 90 tablet 1  . oxyCODONE (ROXICODONE) 15 MG immediate release tablet Take 1 tablet (15 mg total) by mouth 4 (four) times daily. 4 tablet 0  . spironolactone (ALDACTONE) 50 MG tablet Take 1 tablet (50 mg total) by mouth daily. 90 tablet 1  . thiamine (VITAMIN B-1) 100 MG tablet Take 100 mg by mouth daily.    .Marland KitchenUNABLE TO FIND ACCU-CHEK SMARTVIEW CONTROL SOLUTION. Check blood sugar daily 1 Bottle 1  . doxycycline (VIBRAMYCIN) 100 MG capsule Take 1 capsule (100 mg total) by mouth 2 (two) times daily. (Patient not taking: Reported on 09/08/2016) 20 capsule 0  . HYDROcodone-homatropine (HYCODAN) 5-1.5 MG/5ML syrup Take 5 mLs by mouth every 8 (eight) hours as needed for cough. (Patient not taking: Reported on 09/08/2016) 120 mL 0   No current facility-administered medications on file prior to visit.     Allergies  Allergen Reactions  . Clindamycin/Lincomycin Swelling  . Cephalexin      Objective:  BP (!) 128/92   Pulse (!) 56  Temp 97.9 F (36.6 C) (Oral)   SpO2 99%   Physical Exam  Constitutional: He is oriented to person, place, and time and well-developed, well-nourished, and in no distress. No distress.  Cardiovascular: Normal rate, regular rhythm and normal heart sounds.   Musculoskeletal:  Amputated below-the-knee  Neurological: He is alert and oriented to person, place, and time. GCS score is 15.    Skin: Skin is warm and dry.  Psychiatric: Mood, memory, affect and judgment normal.  Vitals reviewed.   Assessment and Plan :  1. Encounter for medication management - RTC in three months for labs/annual   2. Uncontrolled type 2 diabetes mellitus with other skin complication, unspecified long term insulin use status ([place]) - Lancets MISC; Check blood sugar daily.  Dispense: 100 each; Refill: 1  3. Insomnia, unspecified type - diazepam (VALIUM) 10 MG tablet; Take 1 tablet (10 mg total) by mouth at bedtime as needed for anxiety.  Dispense: 45 tablet; Refill: 0 - diazepam (VALIUM) 10 MG tablet; Take 1 tablet (10 mg total) by mouth at bedtime as needed for anxiety.  Dispense: 30 tablet; Refill: 0 - diazepam (VALIUM) 10 MG tablet; Take 1 tablet (10 mg total) by mouth at bedtime as needed for anxiety.  Dispense: 30 tablet; Refill: 0 - diazepam (VALIUM) 10 MG tablet; Take 1 tablet (10 mg total) by mouth at bedtime as needed for anxiety.  Dispense: 30 tablet; Refill: 1  4. Pain in joint of right shoulder - Ambulatory referral to Orthopedic Surgery - Will not inject today as pt is uncontrolled diabetic with recent btk amputation.   5. Need for prophylactic vaccination and inoculation against influenza - Flu Vaccine QUAD 36+ mos IM    Mercer Pod, PA-C  [facility] 09/08/2016 11:37 AM

## 2016-09-09 LAB — CMP14+EGFR
ALT: 17 IU/L (ref 0–44)
AST: 30 IU/L (ref 0–40)
Albumin/Globulin Ratio: 1.3 (ref 1.2–2.2)
Albumin: 4 g/dL (ref 3.5–5.5)
Alkaline Phosphatase: 68 IU/L (ref 39–117)
BUN/Creatinine Ratio: 11 (ref 9–20)
BUN: 7 mg/dL (ref 6–24)
Bilirubin Total: 1.4 mg/dL — ABNORMAL HIGH (ref 0.0–1.2)
CO2: 23 mmol/L (ref 18–29)
Calcium: 9.9 mg/dL (ref 8.7–10.2)
Chloride: 104 mmol/L (ref 96–106)
Creatinine, Ser: 0.64 mg/dL — ABNORMAL LOW (ref 0.76–1.27)
GFR calc Af Amer: 125 mL/min/{1.73_m2} (ref >59)
GFR calc non Af Amer: 108 mL/min/{1.73_m2} (ref >59)
Globulin, Total: 3.1 g/dL (ref 1.5–4.5)
Glucose: 110 mg/dL — ABNORMAL HIGH (ref 65–99)
Potassium: 4.6 mmol/L (ref 3.5–5.2)
Sodium: 141 mmol/L (ref 134–144)
Total Protein: 7.1 g/dL (ref 6.0–8.5)

## 2016-09-09 LAB — HEMOGLOBIN A1C
Est. average glucose Bld gHb Est-mCnc: 100 mg/dL
Hgb A1c MFr Bld: 5.1 % (ref 4.8–5.6)

## 2016-09-24 ENCOUNTER — Telehealth: Payer: Self-pay

## 2016-09-24 NOTE — Telephone Encounter (Signed)
THIS MESSAGE IS FROM PATIENT'S WIFE (LINDA). SHE SAID JASON FROM [facility] TOLD HER HE FAXED OVER AN ORDER FOR DR. Neva SeatGREENE TO APPROVE A WHEELCHAIR. HE TOLD HER THAT HE FAXED IT TWICE LAST WEEK AND AGAIN TODAY. SHE DOES NOT UNDERSTAND WHY WE WILL NOT RESPOND? BEST PHONE [phone] (WIFE IS LINDA Kyllo) MBC

## 2016-09-24 NOTE — Telephone Encounter (Signed)
I HAVE NOT SEEN THIS, HAVE YOU?

## 2016-09-27 NOTE — Telephone Encounter (Signed)
This should have been faxed on Thursday when I completed other ppwk. If they have still not received it - please obtain new request and I can complete it ASAP. Thanks.

## 2016-09-29 NOTE — Telephone Encounter (Signed)
[name] will contact [facility], and get them to send it again.  The fax was sent to [phone] prior several times.  I have advised to send to fax [phone] so we can resend.

## 2016-11-09 DIAGNOSIS — G894 Chronic pain syndrome: Secondary | ICD-10-CM | POA: Diagnosis not present

## 2016-11-12 DIAGNOSIS — Z89511 Acquired absence of right leg below knee: Secondary | ICD-10-CM | POA: Diagnosis not present

## 2016-11-12 DIAGNOSIS — S80221D Blister (nonthermal), right knee, subsequent encounter: Secondary | ICD-10-CM | POA: Diagnosis not present

## 2016-11-12 DIAGNOSIS — E119 Type 2 diabetes mellitus without complications: Secondary | ICD-10-CM | POA: Diagnosis not present

## 2016-11-12 DIAGNOSIS — Z4800 Encounter for change or removal of nonsurgical wound dressing: Secondary | ICD-10-CM | POA: Diagnosis not present

## 2016-11-12 DIAGNOSIS — M84361D Stress fracture, right tibia, subsequent encounter for fracture with routine healing: Secondary | ICD-10-CM | POA: Diagnosis not present

## 2016-11-13 DIAGNOSIS — S80221D Blister (nonthermal), right knee, subsequent encounter: Secondary | ICD-10-CM | POA: Diagnosis not present

## 2016-11-13 DIAGNOSIS — M84361D Stress fracture, right tibia, subsequent encounter for fracture with routine healing: Secondary | ICD-10-CM | POA: Diagnosis not present

## 2016-11-13 DIAGNOSIS — Z4800 Encounter for change or removal of nonsurgical wound dressing: Secondary | ICD-10-CM | POA: Diagnosis not present

## 2016-11-13 DIAGNOSIS — E119 Type 2 diabetes mellitus without complications: Secondary | ICD-10-CM | POA: Diagnosis not present

## 2016-11-13 DIAGNOSIS — Z89511 Acquired absence of right leg below knee: Secondary | ICD-10-CM | POA: Diagnosis not present

## 2016-11-16 DIAGNOSIS — M84361D Stress fracture, right tibia, subsequent encounter for fracture with routine healing: Secondary | ICD-10-CM | POA: Diagnosis not present

## 2016-11-16 DIAGNOSIS — Z4800 Encounter for change or removal of nonsurgical wound dressing: Secondary | ICD-10-CM | POA: Diagnosis not present

## 2016-11-16 DIAGNOSIS — Z89511 Acquired absence of right leg below knee: Secondary | ICD-10-CM | POA: Diagnosis not present

## 2016-11-16 DIAGNOSIS — S80221D Blister (nonthermal), right knee, subsequent encounter: Secondary | ICD-10-CM | POA: Diagnosis not present

## 2016-11-16 DIAGNOSIS — E119 Type 2 diabetes mellitus without complications: Secondary | ICD-10-CM | POA: Diagnosis not present

## 2016-11-18 DIAGNOSIS — M6281 Muscle weakness (generalized): Secondary | ICD-10-CM | POA: Diagnosis not present

## 2016-11-18 DIAGNOSIS — R2681 Unsteadiness on feet: Secondary | ICD-10-CM | POA: Diagnosis not present

## 2016-11-18 DIAGNOSIS — E119 Type 2 diabetes mellitus without complications: Secondary | ICD-10-CM | POA: Diagnosis not present

## 2016-11-18 DIAGNOSIS — M84361D Stress fracture, right tibia, subsequent encounter for fracture with routine healing: Secondary | ICD-10-CM | POA: Diagnosis not present

## 2016-11-18 DIAGNOSIS — Z89511 Acquired absence of right leg below knee: Secondary | ICD-10-CM | POA: Diagnosis not present

## 2016-11-19 DIAGNOSIS — Z79891 Long term (current) use of opiate analgesic: Secondary | ICD-10-CM | POA: Diagnosis not present

## 2016-11-19 DIAGNOSIS — G894 Chronic pain syndrome: Secondary | ICD-10-CM | POA: Diagnosis not present

## 2016-11-19 DIAGNOSIS — Z89511 Acquired absence of right leg below knee: Secondary | ICD-10-CM | POA: Diagnosis not present

## 2016-11-23 DIAGNOSIS — M85861 Other specified disorders of bone density and structure, right lower leg: Secondary | ICD-10-CM | POA: Diagnosis not present

## 2016-11-23 DIAGNOSIS — S82191A Other fracture of upper end of right tibia, initial encounter for closed fracture: Secondary | ICD-10-CM | POA: Diagnosis not present

## 2016-11-23 DIAGNOSIS — M25461 Effusion, right knee: Secondary | ICD-10-CM | POA: Diagnosis not present

## 2016-11-23 DIAGNOSIS — S82401D Unspecified fracture of shaft of right fibula, subsequent encounter for closed fracture with routine healing: Secondary | ICD-10-CM | POA: Diagnosis not present

## 2016-11-23 DIAGNOSIS — S82141D Displaced bicondylar fracture of right tibia, subsequent encounter for closed fracture with routine healing: Secondary | ICD-10-CM | POA: Diagnosis not present

## 2016-11-23 DIAGNOSIS — I10 Essential (primary) hypertension: Secondary | ICD-10-CM | POA: Diagnosis not present

## 2016-11-24 ENCOUNTER — Telehealth: Payer: Self-pay | Admitting: *Deleted

## 2016-11-24 NOTE — Telephone Encounter (Signed)
Spoke with [facility] advised to send paperwork to orthopedic surgeon.

## 2016-11-25 DIAGNOSIS — M84361D Stress fracture, right tibia, subsequent encounter for fracture with routine healing: Secondary | ICD-10-CM | POA: Diagnosis not present

## 2016-11-25 DIAGNOSIS — E119 Type 2 diabetes mellitus without complications: Secondary | ICD-10-CM | POA: Diagnosis not present

## 2016-11-25 DIAGNOSIS — R2681 Unsteadiness on feet: Secondary | ICD-10-CM | POA: Diagnosis not present

## 2016-11-25 DIAGNOSIS — Z89511 Acquired absence of right leg below knee: Secondary | ICD-10-CM | POA: Diagnosis not present

## 2016-11-25 DIAGNOSIS — M6281 Muscle weakness (generalized): Secondary | ICD-10-CM | POA: Diagnosis not present

## 2016-11-27 DIAGNOSIS — E119 Type 2 diabetes mellitus without complications: Secondary | ICD-10-CM | POA: Diagnosis not present

## 2016-11-27 DIAGNOSIS — M6281 Muscle weakness (generalized): Secondary | ICD-10-CM | POA: Diagnosis not present

## 2016-11-27 DIAGNOSIS — R2681 Unsteadiness on feet: Secondary | ICD-10-CM | POA: Diagnosis not present

## 2016-11-27 DIAGNOSIS — Z89511 Acquired absence of right leg below knee: Secondary | ICD-10-CM | POA: Diagnosis not present

## 2016-11-27 DIAGNOSIS — M84361D Stress fracture, right tibia, subsequent encounter for fracture with routine healing: Secondary | ICD-10-CM | POA: Diagnosis not present

## 2016-11-30 DIAGNOSIS — M84361D Stress fracture, right tibia, subsequent encounter for fracture with routine healing: Secondary | ICD-10-CM | POA: Diagnosis not present

## 2016-11-30 DIAGNOSIS — E119 Type 2 diabetes mellitus without complications: Secondary | ICD-10-CM | POA: Diagnosis not present

## 2016-11-30 DIAGNOSIS — Z89511 Acquired absence of right leg below knee: Secondary | ICD-10-CM | POA: Diagnosis not present

## 2016-11-30 DIAGNOSIS — R2681 Unsteadiness on feet: Secondary | ICD-10-CM | POA: Diagnosis not present

## 2016-11-30 DIAGNOSIS — M6281 Muscle weakness (generalized): Secondary | ICD-10-CM | POA: Diagnosis not present

## 2016-12-07 DIAGNOSIS — M84361D Stress fracture, right tibia, subsequent encounter for fracture with routine healing: Secondary | ICD-10-CM | POA: Diagnosis not present

## 2016-12-07 DIAGNOSIS — Z89511 Acquired absence of right leg below knee: Secondary | ICD-10-CM | POA: Diagnosis not present

## 2016-12-07 DIAGNOSIS — E119 Type 2 diabetes mellitus without complications: Secondary | ICD-10-CM | POA: Diagnosis not present

## 2016-12-07 DIAGNOSIS — R2681 Unsteadiness on feet: Secondary | ICD-10-CM | POA: Diagnosis not present

## 2016-12-07 DIAGNOSIS — M6281 Muscle weakness (generalized): Secondary | ICD-10-CM | POA: Diagnosis not present

## 2016-12-09 DIAGNOSIS — M84361D Stress fracture, right tibia, subsequent encounter for fracture with routine healing: Secondary | ICD-10-CM | POA: Diagnosis not present

## 2016-12-09 DIAGNOSIS — R2681 Unsteadiness on feet: Secondary | ICD-10-CM | POA: Diagnosis not present

## 2016-12-09 DIAGNOSIS — M6281 Muscle weakness (generalized): Secondary | ICD-10-CM | POA: Diagnosis not present

## 2016-12-09 DIAGNOSIS — Z89511 Acquired absence of right leg below knee: Secondary | ICD-10-CM | POA: Diagnosis not present

## 2016-12-09 DIAGNOSIS — G894 Chronic pain syndrome: Secondary | ICD-10-CM | POA: Diagnosis not present

## 2016-12-09 DIAGNOSIS — E119 Type 2 diabetes mellitus without complications: Secondary | ICD-10-CM | POA: Diagnosis not present

## 2016-12-14 DIAGNOSIS — Z89511 Acquired absence of right leg below knee: Secondary | ICD-10-CM | POA: Diagnosis not present

## 2016-12-21 ENCOUNTER — Ambulatory Visit (INDEPENDENT_AMBULATORY_CARE_PROVIDER_SITE_OTHER): Payer: Medicare Other | Admitting: Family Medicine

## 2016-12-21 ENCOUNTER — Encounter: Payer: Self-pay | Admitting: Family Medicine

## 2016-12-21 VITALS — BP 124/66 | HR 60 | Temp 98.0°F | Resp 16

## 2016-12-21 DIAGNOSIS — F329 Major depressive disorder, single episode, unspecified: Secondary | ICD-10-CM | POA: Diagnosis not present

## 2016-12-21 DIAGNOSIS — M6281 Muscle weakness (generalized): Secondary | ICD-10-CM | POA: Diagnosis not present

## 2016-12-21 DIAGNOSIS — F5104 Psychophysiologic insomnia: Secondary | ICD-10-CM | POA: Diagnosis not present

## 2016-12-21 DIAGNOSIS — F32A Depression, unspecified: Secondary | ICD-10-CM

## 2016-12-21 DIAGNOSIS — R2681 Unsteadiness on feet: Secondary | ICD-10-CM | POA: Diagnosis not present

## 2016-12-21 DIAGNOSIS — Z89511 Acquired absence of right leg below knee: Secondary | ICD-10-CM | POA: Diagnosis not present

## 2016-12-21 DIAGNOSIS — M84361D Stress fracture, right tibia, subsequent encounter for fracture with routine healing: Secondary | ICD-10-CM | POA: Diagnosis not present

## 2016-12-21 DIAGNOSIS — E119 Type 2 diabetes mellitus without complications: Secondary | ICD-10-CM | POA: Diagnosis not present

## 2016-12-21 MED ORDER — HYDROXYZINE HCL 25 MG PO TABS: 25.0000 mg | ORAL_TABLET | Freq: Three times a day (TID) | ORAL | 0 refills | Status: AC | PRN

## 2016-12-21 MED ORDER — PAROXETINE HCL 10 MG PO TABS: 10.0000 mg | ORAL_TABLET | Freq: Every day | ORAL | 1 refills | Status: DC

## 2016-12-21 MED ORDER — DIAZEPAM 5 MG PO TABS: 5.0000 mg | ORAL_TABLET | Freq: Every evening | ORAL | 0 refills | Status: AC | PRN

## 2016-12-21 NOTE — Progress Notes (Signed)
By signing my name below, I, Mesha Guinyard, attest that this documentation has been prepared under the direction and in the presence of Merri Ray, MD.  Electronically Signed: Verlee Monte, Medical Scribe. 12/21/16. 5:04 PM.  Subjective:    Patient ID: [name], male    DOB: 05-Jun-1956, 61 y.o.   MRN: [id number]  HPI Chief Complaint  Patient presents with  . Medication Refill    Valium 10 mg, (pt wants to discuss Paroxetine 10 mg)    HPI Comments: [name] is a 61 y.o. male who presents to Primary Care at [facility] for medication refill. Last seen by [facility], PA-C, but previous pt of Dr. Joseph Art. Had a visit with her 09/08/16, he was using valium 30 mg QHS. Hx noted of alcohol use, reported half gallon per day. Was also being followed by pain management in [facility] - oxycodone 20 mg, 4 months of valium was rx'ed. Pt would like me to become his PCP.  Trouble Sleeping: Pt would like to get valium refilled - he takes it 3-4x a week QHS to help him sleep. Pt has tried melatonin and other OTC medication without success. Pain management physician is Dr. Fara Olden - they rx'ed his first prescription of valium and know he takes it. Pt takes oxycodone 15 mg BID for pain relief and he plans on weaning himself off his controlled medications. Pt reports his wife handles all of his drugs and he doesn't know where they are kept. Pt does not use any illicit drugs (i.e.: cocaine, marijuanan) and he gets drug tested every 2 months at pain management clinic.  Depression: Pt has been taking paroxetine 10 mg QD for his depression with relief of his sxs for years. He would ike to get a refill. Pt's last drink was in 2007, he no longer drinks. Denies SI, and thoughts of harming himself. Depression screen [facility] 2/9 12/21/2016 09/08/2016 01/23/2016 11/08/2015 11/05/2015  Decreased Interest 0 0 0 0 0  Down, Depressed, Hopeless 0 0 0 0 0  PHQ - 2 Score 0 0 0 0 0   DM: Pt is not being followed by for  his DM. Lab Results  Component Value Date   HGBA1C 5.1 09/08/2016   Stroke: Reports cut nerve in his right arm caused weakness, not a stroke. Pt denies PMHx of stroke or bleeding in the brian.  Patient Active Problem List   Diagnosis Date Noted  . Alcohol abuse, in remission 08/29/2014  . Chronic pain associated with significant psychosocial dysfunction 08/29/2014  . Type 2 diabetes mellitus with other diabetic neurological complication ([place]) 77/93/9030  . Esophageal varices ([place]) 03/05/2014  . Diabetes type 2, uncontrolled ([place]) 09/24/2011  . Hypertension 09/24/2011  . Cirrhosis ([place]) 09/24/2011  . Cerebral artery occlusion with cerebral infarction ([place]) 09/24/2011  . Alcoholic cirrhosis ([place]) 05/02/3006   Past Medical History:  Diagnosis Date  . Anxiety   . Cirrhosis ([place])   . Depression   . Diabetes mellitus without complication ([place])   . Foot pain   . Hypertension    No past surgical history on file. Allergies  Allergen Reactions  . Clindamycin/Lincomycin Swelling  . Cephalexin    Prior to Admission medications   Medication Sig Start Date End Date Taking? Authorizing Provider  ACCU-CHEK SMARTVIEW test strip CHECK BLOOD SUGAR DAILY 07/23/16   Wendie Agreste, MD  Alcohol Swabs PADS Check blood sugar daily 10/25/13   Robyn Haber, MD  Blood Glucose Monitoring Suppl (BLOOD GLUCOSE METER) kit  Check blood sugar daily 10/25/13   Robyn Haber, MD  doxycycline (VIBRAMYCIN) 100 MG capsule Take 1 capsule (100 mg total) by mouth 2 (two) times daily. Patient not taking: Reported on 09/08/2016 02/07/16   Wendie Agreste, MD  HYDROcodone-homatropine [facility]) 5-1.5 MG/5ML syrup Take 5 mLs by mouth every 8 (eight) hours as needed for cough. Patient not taking: Reported on 09/08/2016 11/05/15   Robyn Haber, MD  Lancets MISC Check blood sugar daily. 09/08/16   McVey, Gelene Mink, PA-C  Multiple Vitamin (MULTIVITAMIN) tablet Take 1 tablet by mouth daily.    [provider]  nadolol (CORGARD) 20 MG tablet Take 1 tablet (20 mg total) by mouth daily. 10/12/14   Robyn Haber, MD  oxyCODONE (ROXICODONE) 15 MG immediate release tablet Take 1 tablet (15 mg total) by mouth 4 (four) times daily. 08/07/14   Wendie Agreste, MD  spironolactone (ALDACTONE) 50 MG tablet Take 1 tablet (50 mg total) by mouth daily. 11/26/14   Robyn Haber, MD  thiamine (VITAMIN B-1) 100 MG tablet Take 100 mg by mouth daily.    [provider]  UNABLE TO [place]. Check blood sugar daily 05/14/16   Gale Journey, Damaris Hippo, PA-C   Social History   Social History  . Marital status: Married    Spouse name: N/A  . Number of children: N/A  . Years of education: N/A   Occupational History  . Not on file.   Social History Main Topics  . Smoking status: Current Every Day Smoker    Packs/day: 0.30    Years: 46.00    Types: Cigarettes    Last attempt to quit: 03/24/2011  . Smokeless tobacco: Never Used  . Alcohol use No  . Drug use: No  . Sexual activity: Yes    Birth control/ protection: None   Other Topics Concern  . Not on file   Social History Narrative  . No narrative on file   Review of Systems  Psychiatric/Behavioral: Positive for dysphoric mood and sleep disturbance. Negative for self-injury and suicidal ideas.   Objective:  Physical Exam  Constitutional: He appears well-developed and well-nourished. No distress.  HENT:  Head: Normocephalic and atraumatic.  Eyes: Conjunctivae are normal.  Neck: Neck supple.  Cardiovascular: Normal rate, regular rhythm and normal heart sounds.  Exam reveals no gallop and no friction rub.   No murmur heard. Pulmonary/Chest: Effort normal and breath sounds normal. No respiratory distress. He has no wheezes. He has no rales.  Neurological: He is alert.  Skin: Skin is warm and dry.  Psychiatric: He has a normal mood and affect. His behavior is normal.  Nursing note and vitals reviewed.    Vitals:   12/21/16 1539  BP: 124/66  Pulse: 60  Resp: 16  Temp: 98 F (36.7 C)  TempSrc: Oral  SpO2: 97%   There is no height or weight on file to calculate BMI. Assessment & Plan:   [name] is a 61 y.o. male Depression, unspecified depression type - Plan: PARoxetine (PAXIL) 10 MG tablet  - stable on paxil 50m QD - refilled.   Psychophysiological insomnia - Plan: diazepam (VALIUM) 5 MG tablet, hydrOXYzine (ATARAX/VISTARIL) 25 MG tablet  - risks and CDC recommendations discussed regarding combined use of narcotics and benzodiazepines. Wean off valium, start hydroxyzine if needed for insomnia. If needed, can refer to psychiatry.   Follow up to discuss diabetes, other chronic medical problems. continue follow up with pain management.  Meds  ordered this encounter  Medications  . PARoxetine (PAXIL) 10 MG tablet    Sig: Take 1 tablet (10 mg total) by mouth daily.    Dispense:  90 tablet    Refill:  1  . DISCONTD: PARoxetine (PAXIL) 10 MG tablet    Sig: Take 10 mg by mouth 2 (two) times daily.  . diazepam (VALIUM) 5 MG tablet    Sig: Take 1 tablet (5 mg total) by mouth at bedtime as needed for anxiety.    Dispense:  30 tablet    Refill:  0  . hydrOXYzine (ATARAX/VISTARIL) 25 MG tablet    Sig: Take 1 tablet (25 mg total) by mouth 3 (three) times daily as needed.    Dispense:  30 tablet    Refill:  0   Patient Instructions    As we discussed, there are too may potential risks with combination of narcotics and benzodiazepines (Valium). I wrote for lower dose to wean off that medication and instead try the hydroxyzine at bedtime (one or the other).  Can try to initially wean to every other day to every third day of Valium and hydroxyzine if needed on other nights. Plan to be off the valium completely in next 3-4 weeks.    Return to the clinic or go to the nearest emergency room if any of your symptoms worsen or new symptoms occur.  Follow up to discuss other medication and  diabetes.     IF you received an x-ray today, you will receive an invoice from [facility]. Please contact [facility] at [phone] with questions or concerns regarding your invoice.   IF you received labwork today, you will receive an invoice from [facility]. Please contact [facility] at [phone] with questions or concerns regarding your invoice.   Our billing staff will not be able to assist you with questions regarding bills from these companies.  You will be contacted with the lab results as soon as they are available. The fastest way to get your results is to activate your My Chart account. Instructions are located on the last page of this paperwork. If you have not heard from [place] regarding the results in 2 weeks, please contact this office.      I personally performed the services described in this documentation, which was scribed in my presence. The recorded information has been reviewed and considered for accuracy and completeness, addended by me as needed, and agree with information above.  Signed,   Merri Ray, MD Primary Care at [place].  12/21/16 11:33 PM

## 2016-12-21 NOTE — Patient Instructions (Addendum)
  As we discussed, there are too may potential risks with combination of narcotics and benzodiazepines (Valium). I wrote for lower dose to wean off that medication and instead try the hydroxyzine at bedtime (one or the other).  Can try to initially wean to every other day to every third day of Valium and hydroxyzine if needed on other nights. Plan to be off the valium completely in next 3-4 weeks.    Return to the clinic or go to the nearest emergency room if any of your symptoms worsen or new symptoms occur.  Follow up to discuss other medication and diabetes.     IF you received an x-ray today, you will receive an invoice from [facility]. Please contact [facility] at [phone] with questions or concerns regarding your invoice.   IF you received labwork today, you will receive an invoice from [facility]. Please contact [facility] at [phone] with questions or concerns regarding your invoice.   Our billing staff will not be able to assist you with questions regarding bills from these companies.  You will be contacted with the lab results as soon as they are available. The fastest way to get your results is to activate your My Chart account. Instructions are located on the last page of this paperwork. If you have not heard from us regarding the results in 2 weeks, please contact this office.

## 2017-01-05 DIAGNOSIS — Z89511 Acquired absence of right leg below knee: Secondary | ICD-10-CM | POA: Diagnosis not present

## 2017-01-06 DIAGNOSIS — Z89511 Acquired absence of right leg below knee: Secondary | ICD-10-CM | POA: Diagnosis not present

## 2017-01-06 DIAGNOSIS — M6281 Muscle weakness (generalized): Secondary | ICD-10-CM | POA: Diagnosis not present

## 2017-01-06 DIAGNOSIS — M84361D Stress fracture, right tibia, subsequent encounter for fracture with routine healing: Secondary | ICD-10-CM | POA: Diagnosis not present

## 2017-01-06 DIAGNOSIS — E119 Type 2 diabetes mellitus without complications: Secondary | ICD-10-CM | POA: Diagnosis not present

## 2017-01-06 DIAGNOSIS — R2681 Unsteadiness on feet: Secondary | ICD-10-CM | POA: Diagnosis not present

## 2017-01-08 DIAGNOSIS — Z89511 Acquired absence of right leg below knee: Secondary | ICD-10-CM | POA: Diagnosis not present

## 2017-01-08 DIAGNOSIS — E119 Type 2 diabetes mellitus without complications: Secondary | ICD-10-CM | POA: Diagnosis not present

## 2017-01-08 DIAGNOSIS — M84361D Stress fracture, right tibia, subsequent encounter for fracture with routine healing: Secondary | ICD-10-CM | POA: Diagnosis not present

## 2017-01-08 DIAGNOSIS — R2681 Unsteadiness on feet: Secondary | ICD-10-CM | POA: Diagnosis not present

## 2017-01-08 DIAGNOSIS — M6281 Muscle weakness (generalized): Secondary | ICD-10-CM | POA: Diagnosis not present

## 2017-01-09 DIAGNOSIS — G894 Chronic pain syndrome: Secondary | ICD-10-CM | POA: Diagnosis not present

## 2017-01-12 DIAGNOSIS — M84361D Stress fracture, right tibia, subsequent encounter for fracture with routine healing: Secondary | ICD-10-CM | POA: Diagnosis not present

## 2017-01-12 DIAGNOSIS — Z89511 Acquired absence of right leg below knee: Secondary | ICD-10-CM | POA: Diagnosis not present

## 2017-01-12 DIAGNOSIS — R2681 Unsteadiness on feet: Secondary | ICD-10-CM | POA: Diagnosis not present

## 2017-01-12 DIAGNOSIS — M6281 Muscle weakness (generalized): Secondary | ICD-10-CM | POA: Diagnosis not present

## 2017-01-12 DIAGNOSIS — E119 Type 2 diabetes mellitus without complications: Secondary | ICD-10-CM | POA: Diagnosis not present

## 2017-01-15 DIAGNOSIS — E119 Type 2 diabetes mellitus without complications: Secondary | ICD-10-CM | POA: Diagnosis not present

## 2017-01-15 DIAGNOSIS — M6281 Muscle weakness (generalized): Secondary | ICD-10-CM | POA: Diagnosis not present

## 2017-01-15 DIAGNOSIS — M84361D Stress fracture, right tibia, subsequent encounter for fracture with routine healing: Secondary | ICD-10-CM | POA: Diagnosis not present

## 2017-01-15 DIAGNOSIS — R2681 Unsteadiness on feet: Secondary | ICD-10-CM | POA: Diagnosis not present

## 2017-01-15 DIAGNOSIS — Z89511 Acquired absence of right leg below knee: Secondary | ICD-10-CM | POA: Diagnosis not present

## 2017-01-19 DIAGNOSIS — M5136 Other intervertebral disc degeneration, lumbar region: Secondary | ICD-10-CM | POA: Diagnosis not present

## 2017-01-19 DIAGNOSIS — G546 Phantom limb syndrome with pain: Secondary | ICD-10-CM | POA: Diagnosis not present

## 2017-01-19 DIAGNOSIS — Z79891 Long term (current) use of opiate analgesic: Secondary | ICD-10-CM | POA: Diagnosis not present

## 2017-01-19 DIAGNOSIS — G894 Chronic pain syndrome: Secondary | ICD-10-CM | POA: Diagnosis not present

## 2017-01-19 DIAGNOSIS — Z89511 Acquired absence of right leg below knee: Secondary | ICD-10-CM | POA: Diagnosis not present

## 2017-01-20 DIAGNOSIS — R2681 Unsteadiness on feet: Secondary | ICD-10-CM | POA: Diagnosis not present

## 2017-01-20 DIAGNOSIS — Z89511 Acquired absence of right leg below knee: Secondary | ICD-10-CM | POA: Diagnosis not present

## 2017-01-20 DIAGNOSIS — M6281 Muscle weakness (generalized): Secondary | ICD-10-CM | POA: Diagnosis not present

## 2017-01-20 DIAGNOSIS — M84361D Stress fracture, right tibia, subsequent encounter for fracture with routine healing: Secondary | ICD-10-CM | POA: Diagnosis not present

## 2017-01-20 DIAGNOSIS — E119 Type 2 diabetes mellitus without complications: Secondary | ICD-10-CM | POA: Diagnosis not present

## 2017-01-22 DIAGNOSIS — E119 Type 2 diabetes mellitus without complications: Secondary | ICD-10-CM | POA: Diagnosis not present

## 2017-01-22 DIAGNOSIS — M6281 Muscle weakness (generalized): Secondary | ICD-10-CM | POA: Diagnosis not present

## 2017-01-22 DIAGNOSIS — M84361D Stress fracture, right tibia, subsequent encounter for fracture with routine healing: Secondary | ICD-10-CM | POA: Diagnosis not present

## 2017-01-22 DIAGNOSIS — Z89511 Acquired absence of right leg below knee: Secondary | ICD-10-CM | POA: Diagnosis not present

## 2017-01-22 DIAGNOSIS — R2681 Unsteadiness on feet: Secondary | ICD-10-CM | POA: Diagnosis not present

## 2017-01-25 DIAGNOSIS — Z89511 Acquired absence of right leg below knee: Secondary | ICD-10-CM | POA: Diagnosis not present

## 2017-02-02 DIAGNOSIS — Z89511 Acquired absence of right leg below knee: Secondary | ICD-10-CM | POA: Diagnosis not present

## 2017-02-03 ENCOUNTER — Telehealth: Payer: Self-pay | Admitting: Family Medicine

## 2017-02-03 DIAGNOSIS — Z89511 Acquired absence of right leg below knee: Secondary | ICD-10-CM | POA: Diagnosis not present

## 2017-02-03 DIAGNOSIS — E119 Type 2 diabetes mellitus without complications: Secondary | ICD-10-CM | POA: Diagnosis not present

## 2017-02-03 DIAGNOSIS — M6281 Muscle weakness (generalized): Secondary | ICD-10-CM | POA: Diagnosis not present

## 2017-02-03 DIAGNOSIS — R2681 Unsteadiness on feet: Secondary | ICD-10-CM | POA: Diagnosis not present

## 2017-02-03 DIAGNOSIS — M84361D Stress fracture, right tibia, subsequent encounter for fracture with routine healing: Secondary | ICD-10-CM | POA: Diagnosis not present

## 2017-02-03 NOTE — Telephone Encounter (Signed)
Lashawna from [facility] called on behalf of pt checking in on home health certification and plan of care order from 4/10-6/8. If these are signed, can we fax these to them at [phone]? If not, can we get these signed and faxed over if we have them. She was going to fax over another order as well in case we do not have these. Rica KoyanagiLashawna can be contacted at [phone]. She would like a callback regarding this once it is completed.

## 2017-02-03 NOTE — Telephone Encounter (Signed)
Order form from [facility] has been placed in your box.  Please have attending nurse contact Lashawna at [phone] when forms have been completed and faxed.  Thank you!

## 2017-02-03 NOTE — Telephone Encounter (Signed)
I found the forms in my fax pile.  Will be labeled and put in doctor's box.  I'll send in a message to physician to call once completed.

## 2017-02-04 NOTE — Telephone Encounter (Signed)
Forms completed and placed in fax box in provider lounge at 102.

## 2017-02-05 NOTE — Telephone Encounter (Signed)
[facility] and also I called to let her know.

## 2017-02-08 DIAGNOSIS — G894 Chronic pain syndrome: Secondary | ICD-10-CM | POA: Diagnosis not present

## 2017-03-11 DIAGNOSIS — G894 Chronic pain syndrome: Secondary | ICD-10-CM | POA: Diagnosis not present

## 2017-03-18 DIAGNOSIS — G8929 Other chronic pain: Secondary | ICD-10-CM | POA: Diagnosis not present

## 2017-03-18 DIAGNOSIS — M545 Low back pain: Secondary | ICD-10-CM | POA: Diagnosis not present

## 2017-03-18 DIAGNOSIS — M25512 Pain in left shoulder: Secondary | ICD-10-CM | POA: Diagnosis not present

## 2017-03-18 DIAGNOSIS — Z79899 Other long term (current) drug therapy: Secondary | ICD-10-CM | POA: Diagnosis not present

## 2017-04-11 DIAGNOSIS — G894 Chronic pain syndrome: Secondary | ICD-10-CM | POA: Diagnosis not present

## 2017-04-14 DIAGNOSIS — M129 Arthropathy, unspecified: Secondary | ICD-10-CM | POA: Diagnosis not present

## 2017-04-14 DIAGNOSIS — E1165 Type 2 diabetes mellitus with hyperglycemia: Secondary | ICD-10-CM | POA: Diagnosis not present

## 2017-04-14 DIAGNOSIS — E7800 Pure hypercholesterolemia, unspecified: Secondary | ICD-10-CM | POA: Diagnosis not present

## 2017-04-14 DIAGNOSIS — E559 Vitamin D deficiency, unspecified: Secondary | ICD-10-CM | POA: Diagnosis not present

## 2017-04-16 DIAGNOSIS — Z79899 Other long term (current) drug therapy: Secondary | ICD-10-CM | POA: Diagnosis not present

## 2017-04-16 DIAGNOSIS — G8929 Other chronic pain: Secondary | ICD-10-CM | POA: Diagnosis not present

## 2017-04-16 DIAGNOSIS — M545 Low back pain: Secondary | ICD-10-CM | POA: Diagnosis not present

## 2017-04-22 ENCOUNTER — Other Ambulatory Visit: Payer: Self-pay | Admitting: Family Medicine

## 2017-04-22 DIAGNOSIS — F32A Depression, unspecified: Secondary | ICD-10-CM

## 2017-04-22 DIAGNOSIS — F329 Major depressive disorder, single episode, unspecified: Secondary | ICD-10-CM

## 2017-04-22 DIAGNOSIS — R5383 Other fatigue: Secondary | ICD-10-CM | POA: Diagnosis not present

## 2017-04-22 NOTE — Telephone Encounter (Signed)
Dr. Neva SeatGreene advised patient in 12/2016 to return in one month.  Patient has not returned to office; will forward to Dr. Neva SeatGreene.

## 2017-04-22 NOTE — Telephone Encounter (Signed)
30 day supply given of Paxil. Please call to schedule appointment.

## 2017-04-26 DIAGNOSIS — E119 Type 2 diabetes mellitus without complications: Secondary | ICD-10-CM | POA: Diagnosis not present

## 2017-05-11 DIAGNOSIS — G894 Chronic pain syndrome: Secondary | ICD-10-CM | POA: Diagnosis not present

## 2017-05-17 DIAGNOSIS — G8929 Other chronic pain: Secondary | ICD-10-CM | POA: Diagnosis not present

## 2017-05-17 DIAGNOSIS — M5136 Other intervertebral disc degeneration, lumbar region: Secondary | ICD-10-CM | POA: Diagnosis not present

## 2017-05-17 DIAGNOSIS — M25512 Pain in left shoulder: Secondary | ICD-10-CM | POA: Diagnosis not present

## 2017-05-17 DIAGNOSIS — Z79899 Other long term (current) drug therapy: Secondary | ICD-10-CM | POA: Diagnosis not present

## 2017-05-24 DIAGNOSIS — Z89511 Acquired absence of right leg below knee: Secondary | ICD-10-CM | POA: Diagnosis not present

## 2017-06-01 ENCOUNTER — Other Ambulatory Visit: Payer: Self-pay | Admitting: Physician Assistant

## 2017-06-01 ENCOUNTER — Other Ambulatory Visit: Payer: Self-pay | Admitting: Family Medicine

## 2017-06-01 DIAGNOSIS — F329 Major depressive disorder, single episode, unspecified: Secondary | ICD-10-CM

## 2017-06-01 DIAGNOSIS — F32A Depression, unspecified: Secondary | ICD-10-CM

## 2017-06-01 DIAGNOSIS — E1142 Type 2 diabetes mellitus with diabetic polyneuropathy: Secondary | ICD-10-CM

## 2017-06-10 ENCOUNTER — Telehealth: Payer: Self-pay | Admitting: Family Medicine

## 2017-06-10 NOTE — Telephone Encounter (Signed)
[facility] needs clarification on the accu-chek test strips and the lancets.  Wants to clarify the strength, quantity and refills. PHARMACY [phone] - Reference  #[id number]

## 2017-06-11 DIAGNOSIS — G894 Chronic pain syndrome: Secondary | ICD-10-CM | POA: Diagnosis not present

## 2017-06-11 NOTE — Telephone Encounter (Signed)
Form filled out, placed in fax pile. Please schedule follow up appointment as I last saw him in May, and asked him to return to discuss diabetes as other conditions needed to be discussed at that visit.

## 2017-06-11 NOTE — Telephone Encounter (Signed)
Patient states that he is going to another practice

## 2017-06-16 DIAGNOSIS — E559 Vitamin D deficiency, unspecified: Secondary | ICD-10-CM | POA: Diagnosis not present

## 2017-06-16 DIAGNOSIS — Z89511 Acquired absence of right leg below knee: Secondary | ICD-10-CM | POA: Diagnosis not present

## 2017-06-16 DIAGNOSIS — M5136 Other intervertebral disc degeneration, lumbar region: Secondary | ICD-10-CM | POA: Diagnosis not present

## 2017-06-16 DIAGNOSIS — G546 Phantom limb syndrome with pain: Secondary | ICD-10-CM | POA: Diagnosis not present

## 2017-06-16 DIAGNOSIS — Z79899 Other long term (current) drug therapy: Secondary | ICD-10-CM | POA: Diagnosis not present

## 2017-06-28 DIAGNOSIS — Z89519 Acquired absence of unspecified leg below knee: Secondary | ICD-10-CM | POA: Diagnosis not present

## 2017-06-28 DIAGNOSIS — K7460 Unspecified cirrhosis of liver: Secondary | ICD-10-CM | POA: Diagnosis not present

## 2017-06-28 DIAGNOSIS — E119 Type 2 diabetes mellitus without complications: Secondary | ICD-10-CM | POA: Diagnosis not present

## 2017-06-28 DIAGNOSIS — Z23 Encounter for immunization: Secondary | ICD-10-CM | POA: Diagnosis not present

## 2017-07-11 DIAGNOSIS — G894 Chronic pain syndrome: Secondary | ICD-10-CM | POA: Diagnosis not present

## 2017-07-14 DIAGNOSIS — Z79899 Other long term (current) drug therapy: Secondary | ICD-10-CM | POA: Diagnosis not present

## 2017-07-14 DIAGNOSIS — Z9181 History of falling: Secondary | ICD-10-CM | POA: Diagnosis not present

## 2017-07-14 DIAGNOSIS — Z1330 Encounter for screening examination for mental health and behavioral disorders, unspecified: Secondary | ICD-10-CM | POA: Diagnosis not present

## 2017-07-14 DIAGNOSIS — M5136 Other intervertebral disc degeneration, lumbar region: Secondary | ICD-10-CM | POA: Diagnosis not present

## 2017-07-27 DIAGNOSIS — Z89511 Acquired absence of right leg below knee: Secondary | ICD-10-CM | POA: Diagnosis not present

## 2017-08-13 DIAGNOSIS — Z79899 Other long term (current) drug therapy: Secondary | ICD-10-CM | POA: Diagnosis not present

## 2017-08-13 DIAGNOSIS — M5136 Other intervertebral disc degeneration, lumbar region: Secondary | ICD-10-CM | POA: Diagnosis not present

## 2017-08-25 ENCOUNTER — Telehealth: Payer: Self-pay | Admitting: Family Medicine

## 2017-08-25 NOTE — Telephone Encounter (Signed)
Copied from CRM [id number]. Topic: Quick Communication - See Telephone Encounter >> Aug 25, 2017  9:59 AM Jolayne Hainesaylor, Brittany L wrote: CRM for notification. See Telephone encounter for:   08/25/17.  Maralyn SagoSarah from [facility] & has a order she is going to fax over for Dr Neva SeatGreene to sign. Please sign and fax back [phone]

## 2017-09-05 DIAGNOSIS — K7469 Other cirrhosis of liver: Secondary | ICD-10-CM | POA: Diagnosis not present

## 2017-09-05 DIAGNOSIS — K7460 Unspecified cirrhosis of liver: Secondary | ICD-10-CM | POA: Diagnosis not present

## 2017-09-05 DIAGNOSIS — R161 Splenomegaly, not elsewhere classified: Secondary | ICD-10-CM | POA: Diagnosis not present

## 2017-09-05 DIAGNOSIS — K766 Portal hypertension: Secondary | ICD-10-CM | POA: Diagnosis not present

## 2017-09-13 DIAGNOSIS — M79606 Pain in leg, unspecified: Secondary | ICD-10-CM | POA: Diagnosis not present

## 2017-09-13 DIAGNOSIS — M545 Low back pain: Secondary | ICD-10-CM | POA: Diagnosis not present

## 2017-09-13 DIAGNOSIS — Z79899 Other long term (current) drug therapy: Secondary | ICD-10-CM | POA: Diagnosis not present

## 2017-09-13 DIAGNOSIS — G8929 Other chronic pain: Secondary | ICD-10-CM | POA: Diagnosis not present

## 2017-09-27 DIAGNOSIS — Z885 Allergy status to narcotic agent status: Secondary | ICD-10-CM | POA: Diagnosis not present

## 2017-09-27 DIAGNOSIS — Z89512 Acquired absence of left leg below knee: Secondary | ICD-10-CM | POA: Diagnosis not present

## 2017-09-27 DIAGNOSIS — Z79899 Other long term (current) drug therapy: Secondary | ICD-10-CM | POA: Diagnosis not present

## 2017-09-27 DIAGNOSIS — S8011XA Contusion of right lower leg, initial encounter: Secondary | ICD-10-CM | POA: Diagnosis not present

## 2017-09-27 DIAGNOSIS — Z888 Allergy status to other drugs, medicaments and biological substances status: Secondary | ICD-10-CM | POA: Diagnosis not present

## 2017-09-27 DIAGNOSIS — Z881 Allergy status to other antibiotic agents status: Secondary | ICD-10-CM | POA: Diagnosis not present

## 2017-09-27 DIAGNOSIS — W01198A Fall on same level from slipping, tripping and stumbling with subsequent striking against other object, initial encounter: Secondary | ICD-10-CM | POA: Diagnosis not present

## 2017-09-27 DIAGNOSIS — M25461 Effusion, right knee: Secondary | ICD-10-CM | POA: Diagnosis not present

## 2017-09-27 DIAGNOSIS — S82101D Unspecified fracture of upper end of right tibia, subsequent encounter for closed fracture with routine healing: Secondary | ICD-10-CM | POA: Diagnosis not present

## 2017-09-27 DIAGNOSIS — Z899 Acquired absence of limb, unspecified: Secondary | ICD-10-CM | POA: Diagnosis not present

## 2017-09-27 DIAGNOSIS — I10 Essential (primary) hypertension: Secondary | ICD-10-CM | POA: Diagnosis not present

## 2017-09-27 DIAGNOSIS — Z79891 Long term (current) use of opiate analgesic: Secondary | ICD-10-CM | POA: Diagnosis not present

## 2017-09-27 DIAGNOSIS — E119 Type 2 diabetes mellitus without complications: Secondary | ICD-10-CM | POA: Diagnosis not present

## 2017-09-27 DIAGNOSIS — R29898 Other symptoms and signs involving the musculoskeletal system: Secondary | ICD-10-CM | POA: Diagnosis not present

## 2017-10-11 DIAGNOSIS — Z89511 Acquired absence of right leg below knee: Secondary | ICD-10-CM | POA: Diagnosis not present

## 2017-10-11 DIAGNOSIS — T8789 Other complications of amputation stump: Secondary | ICD-10-CM | POA: Diagnosis not present

## 2017-10-11 DIAGNOSIS — L89899 Pressure ulcer of other site, unspecified stage: Secondary | ICD-10-CM | POA: Diagnosis not present

## 2017-10-12 DIAGNOSIS — M5136 Other intervertebral disc degeneration, lumbar region: Secondary | ICD-10-CM | POA: Diagnosis not present

## 2017-10-12 DIAGNOSIS — Z79899 Other long term (current) drug therapy: Secondary | ICD-10-CM | POA: Diagnosis not present

## 2017-11-12 DIAGNOSIS — Z79899 Other long term (current) drug therapy: Secondary | ICD-10-CM | POA: Diagnosis not present

## 2017-11-12 DIAGNOSIS — M5136 Other intervertebral disc degeneration, lumbar region: Secondary | ICD-10-CM | POA: Diagnosis not present

## 2017-11-12 DIAGNOSIS — G546 Phantom limb syndrome with pain: Secondary | ICD-10-CM | POA: Diagnosis not present

## 2017-11-22 DIAGNOSIS — T8789 Other complications of amputation stump: Secondary | ICD-10-CM | POA: Diagnosis not present

## 2017-11-22 DIAGNOSIS — Z89511 Acquired absence of right leg below knee: Secondary | ICD-10-CM | POA: Diagnosis not present

## 2017-11-22 DIAGNOSIS — L89899 Pressure ulcer of other site, unspecified stage: Secondary | ICD-10-CM | POA: Diagnosis not present

## 2017-12-09 DIAGNOSIS — G546 Phantom limb syndrome with pain: Secondary | ICD-10-CM | POA: Diagnosis not present

## 2017-12-09 DIAGNOSIS — Z79899 Other long term (current) drug therapy: Secondary | ICD-10-CM | POA: Diagnosis not present

## 2017-12-09 DIAGNOSIS — M5136 Other intervertebral disc degeneration, lumbar region: Secondary | ICD-10-CM | POA: Diagnosis not present

## 2017-12-14 DIAGNOSIS — E1165 Type 2 diabetes mellitus with hyperglycemia: Secondary | ICD-10-CM | POA: Diagnosis not present

## 2017-12-14 DIAGNOSIS — I10 Essential (primary) hypertension: Secondary | ICD-10-CM | POA: Diagnosis not present

## 2017-12-14 DIAGNOSIS — Z89511 Acquired absence of right leg below knee: Secondary | ICD-10-CM | POA: Diagnosis not present

## 2017-12-14 DIAGNOSIS — E7800 Pure hypercholesterolemia, unspecified: Secondary | ICD-10-CM | POA: Diagnosis not present

## 2017-12-23 DIAGNOSIS — R0989 Other specified symptoms and signs involving the circulatory and respiratory systems: Secondary | ICD-10-CM | POA: Diagnosis not present

## 2017-12-23 DIAGNOSIS — I739 Peripheral vascular disease, unspecified: Secondary | ICD-10-CM | POA: Diagnosis not present

## 2018-01-04 DIAGNOSIS — E1165 Type 2 diabetes mellitus with hyperglycemia: Secondary | ICD-10-CM | POA: Diagnosis not present

## 2018-01-04 DIAGNOSIS — I10 Essential (primary) hypertension: Secondary | ICD-10-CM | POA: Diagnosis not present

## 2018-01-04 DIAGNOSIS — M25511 Pain in right shoulder: Secondary | ICD-10-CM | POA: Diagnosis not present

## 2018-01-04 DIAGNOSIS — E7800 Pure hypercholesterolemia, unspecified: Secondary | ICD-10-CM | POA: Diagnosis not present

## 2018-01-10 DIAGNOSIS — M5136 Other intervertebral disc degeneration, lumbar region: Secondary | ICD-10-CM | POA: Diagnosis not present

## 2018-01-10 DIAGNOSIS — Z79899 Other long term (current) drug therapy: Secondary | ICD-10-CM | POA: Diagnosis not present

## 2018-01-10 DIAGNOSIS — G546 Phantom limb syndrome with pain: Secondary | ICD-10-CM | POA: Diagnosis not present

## 2018-02-04 IMAGING — CR DG CHEST 2V
2 series · 2 of 2 positions shown · non-contrast
Comparison: 01/16/2006.

CLINICAL DATA: Cough and fever.

EXAM:
CHEST  2 VIEW

[PA]
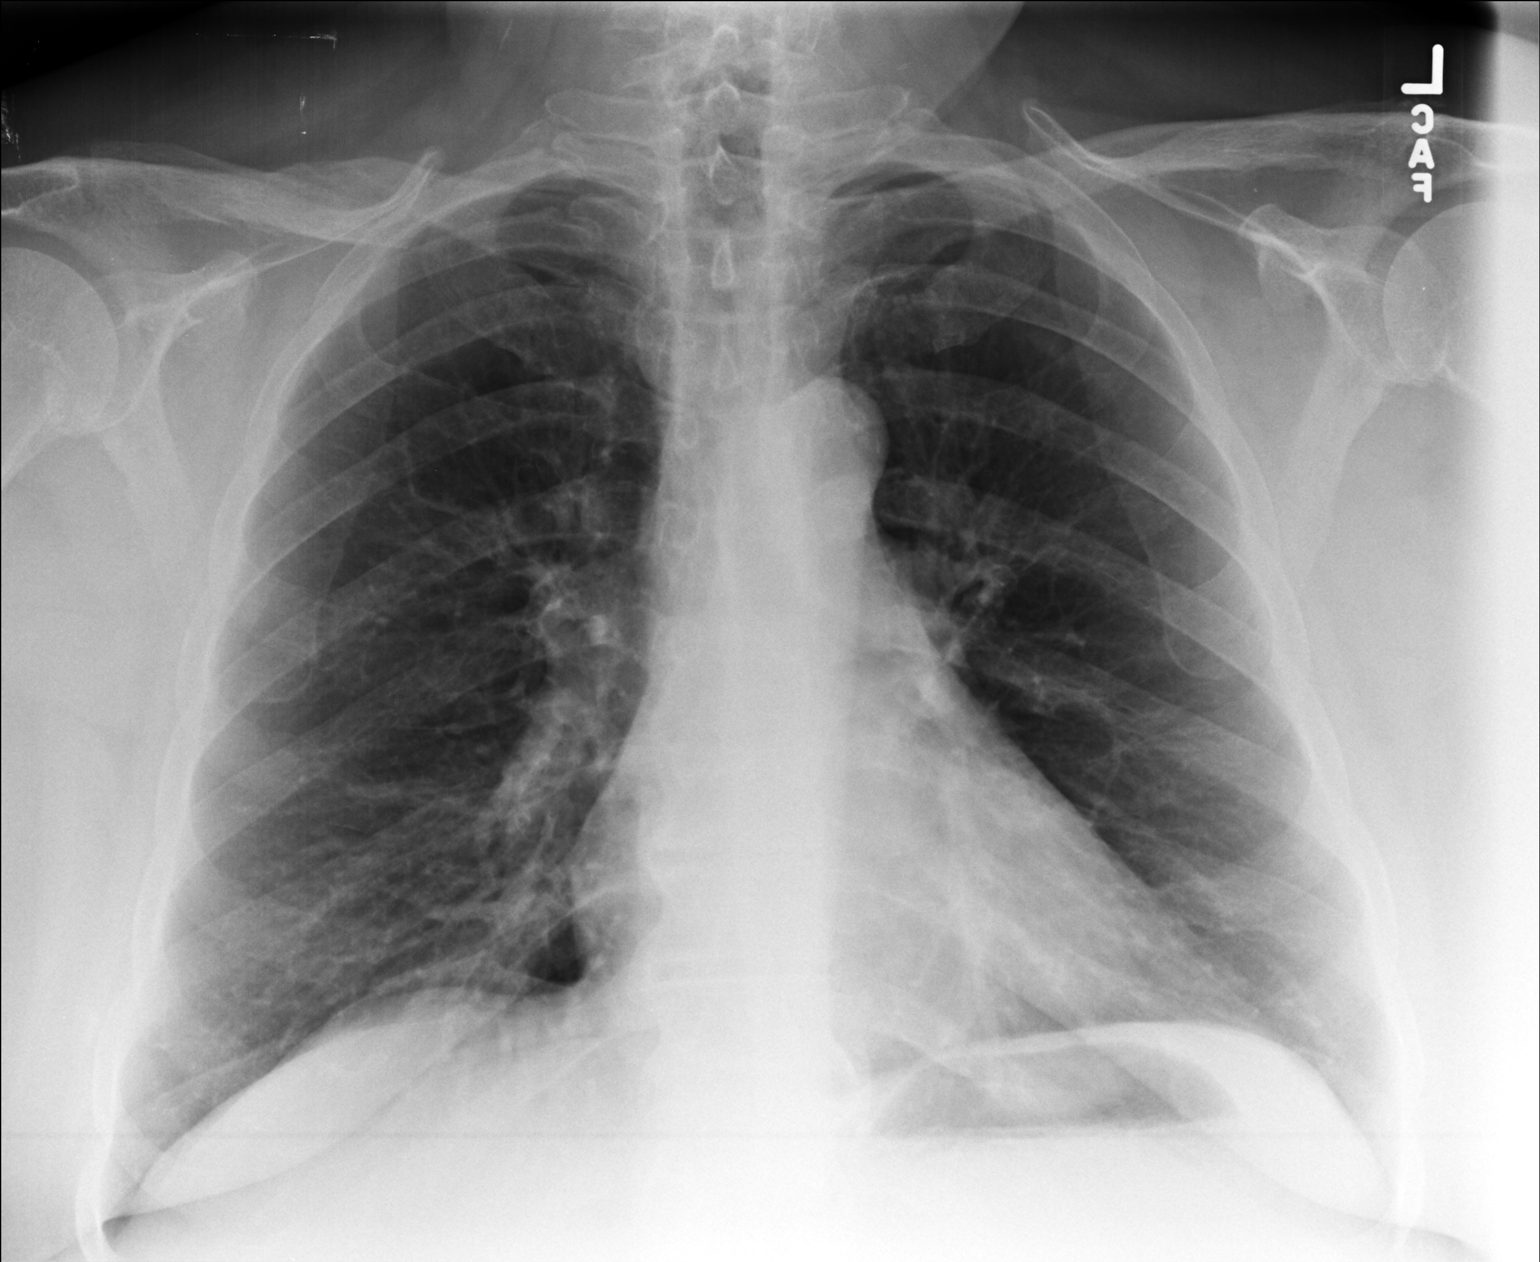

[lateral]
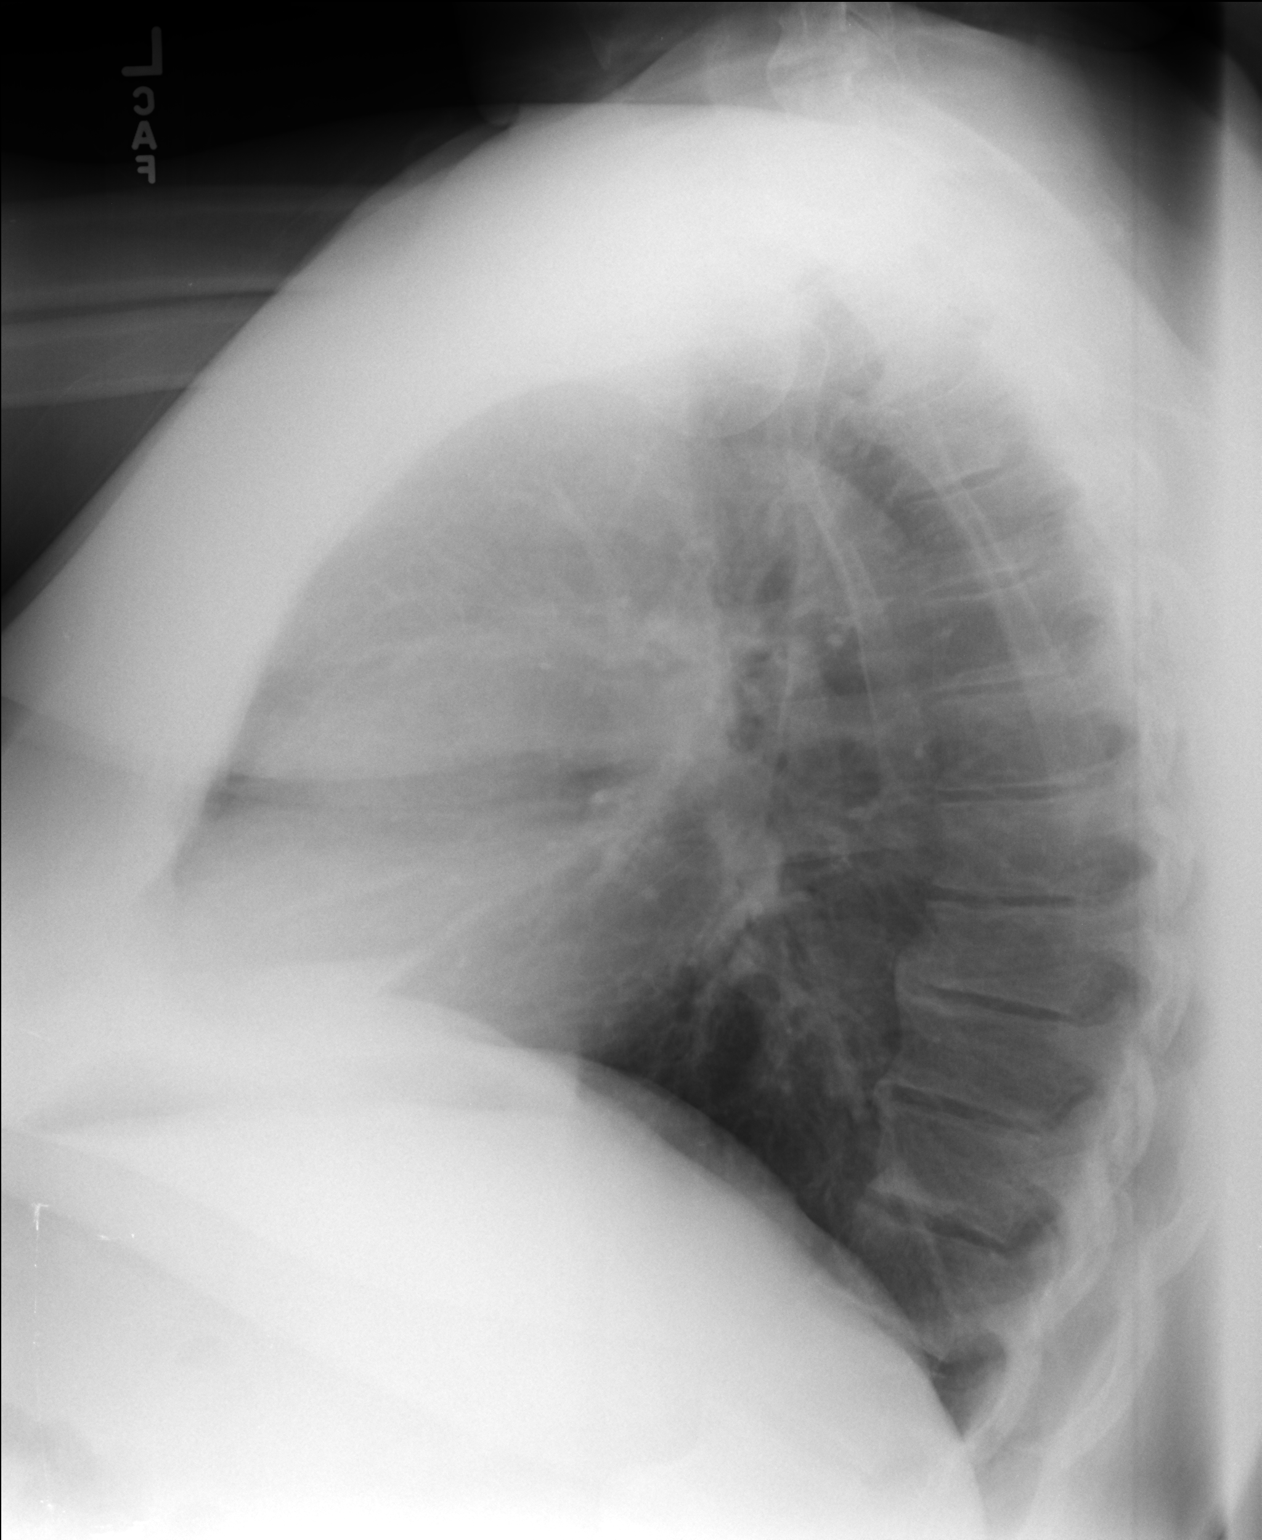

[2 of 2 positions shown; findings below may reference images not displayed]

FINDINGS: Mediastinum and hilar structures are normal. Mild bibasilar
subsegmental atelectasis and or infiltrates. No pleural effusion or
pneumothorax.
IMPRESSION: Mild bibasilar subsegmental atelectasis and/or infiltrates.

## 2018-02-09 DIAGNOSIS — Z79899 Other long term (current) drug therapy: Secondary | ICD-10-CM | POA: Diagnosis not present

## 2018-02-09 DIAGNOSIS — M5136 Other intervertebral disc degeneration, lumbar region: Secondary | ICD-10-CM | POA: Diagnosis not present

## 2018-03-15 DIAGNOSIS — I10 Essential (primary) hypertension: Secondary | ICD-10-CM | POA: Diagnosis not present

## 2018-03-15 DIAGNOSIS — E1165 Type 2 diabetes mellitus with hyperglycemia: Secondary | ICD-10-CM | POA: Diagnosis not present

## 2018-03-15 DIAGNOSIS — M79661 Pain in right lower leg: Secondary | ICD-10-CM | POA: Diagnosis not present

## 2018-03-15 DIAGNOSIS — L03115 Cellulitis of right lower limb: Secondary | ICD-10-CM | POA: Diagnosis not present

## 2018-03-15 DIAGNOSIS — Z79891 Long term (current) use of opiate analgesic: Secondary | ICD-10-CM | POA: Diagnosis not present

## 2018-03-15 DIAGNOSIS — Z7984 Long term (current) use of oral hypoglycemic drugs: Secondary | ICD-10-CM | POA: Diagnosis not present

## 2018-03-15 DIAGNOSIS — E119 Type 2 diabetes mellitus without complications: Secondary | ICD-10-CM | POA: Diagnosis not present

## 2018-03-15 DIAGNOSIS — Z4801 Encounter for change or removal of surgical wound dressing: Secondary | ICD-10-CM | POA: Diagnosis not present

## 2018-03-15 DIAGNOSIS — M79609 Pain in unspecified limb: Secondary | ICD-10-CM | POA: Diagnosis not present

## 2018-03-15 DIAGNOSIS — T8189XA Other complications of procedures, not elsewhere classified, initial encounter: Secondary | ICD-10-CM | POA: Diagnosis not present

## 2018-03-15 DIAGNOSIS — G894 Chronic pain syndrome: Secondary | ICD-10-CM | POA: Diagnosis not present

## 2018-03-15 DIAGNOSIS — B9561 Methicillin susceptible Staphylococcus aureus infection as the cause of diseases classified elsewhere: Secondary | ICD-10-CM | POA: Diagnosis not present

## 2018-03-15 DIAGNOSIS — Z9889 Other specified postprocedural states: Secondary | ICD-10-CM | POA: Diagnosis not present

## 2018-03-15 DIAGNOSIS — Z8673 Personal history of transient ischemic attack (TIA), and cerebral infarction without residual deficits: Secondary | ICD-10-CM | POA: Diagnosis not present

## 2018-03-15 DIAGNOSIS — T8743 Infection of amputation stump, right lower extremity: Secondary | ICD-10-CM | POA: Diagnosis not present

## 2018-03-15 DIAGNOSIS — T879 Unspecified complications of amputation stump: Secondary | ICD-10-CM | POA: Diagnosis not present

## 2018-03-15 DIAGNOSIS — K5909 Other constipation: Secondary | ICD-10-CM | POA: Diagnosis not present

## 2018-03-15 DIAGNOSIS — T8789 Other complications of amputation stump: Secondary | ICD-10-CM | POA: Diagnosis not present

## 2018-03-15 DIAGNOSIS — Z89511 Acquired absence of right leg below knee: Secondary | ICD-10-CM | POA: Diagnosis not present

## 2018-03-15 DIAGNOSIS — Z79899 Other long term (current) drug therapy: Secondary | ICD-10-CM | POA: Diagnosis not present

## 2018-03-15 DIAGNOSIS — T8740 Infection of amputation stump, unspecified extremity: Secondary | ICD-10-CM | POA: Diagnosis not present

## 2018-03-28 DIAGNOSIS — I10 Essential (primary) hypertension: Secondary | ICD-10-CM | POA: Diagnosis not present

## 2018-03-28 DIAGNOSIS — Z89511 Acquired absence of right leg below knee: Secondary | ICD-10-CM | POA: Diagnosis not present

## 2018-03-28 DIAGNOSIS — Z4801 Encounter for change or removal of surgical wound dressing: Secondary | ICD-10-CM | POA: Diagnosis not present

## 2018-03-28 DIAGNOSIS — E119 Type 2 diabetes mellitus without complications: Secondary | ICD-10-CM | POA: Diagnosis not present

## 2018-03-30 DIAGNOSIS — Z4801 Encounter for change or removal of surgical wound dressing: Secondary | ICD-10-CM | POA: Diagnosis not present

## 2018-03-30 DIAGNOSIS — I10 Essential (primary) hypertension: Secondary | ICD-10-CM | POA: Diagnosis not present

## 2018-03-30 DIAGNOSIS — Z89511 Acquired absence of right leg below knee: Secondary | ICD-10-CM | POA: Diagnosis not present

## 2018-03-30 DIAGNOSIS — E119 Type 2 diabetes mellitus without complications: Secondary | ICD-10-CM | POA: Diagnosis not present

## 2018-03-30 MED ORDER — MORPHINE SULFATE (PF) 4 MG/ML IV SOLN
4.00 | INTRAVENOUS | Status: DC
Start: ? — End: 2018-03-30

## 2018-03-30 MED ORDER — BISACODYL 10 MG RE SUPP
10.00 | RECTAL | Status: DC
Start: ? — End: 2018-03-30

## 2018-03-30 MED ORDER — FUROSEMIDE 10 MG/ML IJ SOLN
20.00 | INTRAMUSCULAR | Status: DC
Start: 2018-03-28 — End: 2018-03-30

## 2018-03-30 MED ORDER — GENERIC EXTERNAL MEDICATION
10.00 | Status: DC
Start: ? — End: 2018-03-30

## 2018-03-30 MED ORDER — PAROXETINE HCL 20 MG PO TABS
10.00 | ORAL_TABLET | ORAL | Status: DC
Start: 2018-03-29 — End: 2018-03-30

## 2018-03-30 MED ORDER — ACETAMINOPHEN 325 MG PO TABS
650.00 | ORAL_TABLET | ORAL | Status: DC
Start: ? — End: 2018-03-30

## 2018-03-30 MED ORDER — DOXYCYCLINE MONOHYDRATE 100 MG PO CAPS
100.00 | ORAL_CAPSULE | ORAL | Status: DC
Start: 2018-03-28 — End: 2018-03-30

## 2018-03-30 MED ORDER — INSULIN GLARGINE 100 UNIT/ML ~~LOC~~ SOLN
1.00 | SUBCUTANEOUS | Status: DC
Start: 2018-03-28 — End: 2018-03-30

## 2018-03-30 MED ORDER — INSULIN GLARGINE 100 UNIT/ML ~~LOC~~ SOLN
1.00 | SUBCUTANEOUS | Status: DC
Start: ? — End: 2018-03-30

## 2018-03-30 MED ORDER — NITROGLYCERIN 0.4 MG SL SUBL
.40 | SUBLINGUAL_TABLET | SUBLINGUAL | Status: DC
Start: ? — End: 2018-03-30

## 2018-03-30 MED ORDER — DEXTROMETHORPHAN-GUAIFENESIN 10-100 MG/5ML PO SYRP
5.00 | ORAL_SOLUTION | ORAL | Status: DC
Start: ? — End: 2018-03-30

## 2018-03-30 MED ORDER — CARVEDILOL 3.125 MG PO TABS
3.13 | ORAL_TABLET | ORAL | Status: DC
Start: 2018-03-28 — End: 2018-03-30

## 2018-03-30 MED ORDER — GENERIC EXTERNAL MEDICATION
650.00 | Status: DC
Start: ? — End: 2018-03-30

## 2018-03-30 MED ORDER — SODIUM CHLORIDE 0.9 % IV SOLN
10.00 | INTRAVENOUS | Status: DC
Start: ? — End: 2018-03-30

## 2018-03-30 MED ORDER — INSULIN LISPRO 100 UNIT/ML ~~LOC~~ SOLN
1.00 | SUBCUTANEOUS | Status: DC
Start: ? — End: 2018-03-30

## 2018-03-30 MED ORDER — PHENOL 1.4 % MT LIQD
2.00 | OROMUCOSAL | Status: DC
Start: ? — End: 2018-03-30

## 2018-03-30 MED ORDER — NICOTINE 14 MG/24HR TD PT24
1.00 | MEDICATED_PATCH | TRANSDERMAL | Status: DC
Start: 2018-03-28 — End: 2018-03-30

## 2018-03-30 MED ORDER — NALOXONE HCL 0.4 MG/ML IJ SOLN
0.40 | INTRAMUSCULAR | Status: DC
Start: ? — End: 2018-03-30

## 2018-03-30 MED ORDER — OXYCODONE HCL 10 MG PO TABS
20.00 | ORAL_TABLET | ORAL | Status: DC
Start: ? — End: 2018-03-30

## 2018-03-30 MED ORDER — GABAPENTIN 100 MG PO CAPS
100.00 | ORAL_CAPSULE | ORAL | Status: DC
Start: 2018-03-28 — End: 2018-03-30

## 2018-03-30 MED ORDER — ALUM & MAG HYDROXIDE-SIMETH 200-200-20 MG/5ML PO SUSP
30.00 | ORAL | Status: DC
Start: ? — End: 2018-03-30

## 2018-03-30 MED ORDER — LACTULOSE 10 GM/15ML PO SOLN
20.00 | ORAL | Status: DC
Start: ? — End: 2018-03-30

## 2018-03-30 MED ORDER — SPIRONOLACTONE 25 MG PO TABS
50.00 | ORAL_TABLET | ORAL | Status: DC
Start: 2018-03-29 — End: 2018-03-30

## 2018-03-30 MED ORDER — INSULIN LISPRO 100 UNIT/ML ~~LOC~~ SOLN
1.00 | SUBCUTANEOUS | Status: DC
Start: 2018-03-28 — End: 2018-03-30

## 2018-03-30 MED ORDER — GENERIC EXTERNAL MEDICATION
1.00 | Status: DC
Start: ? — End: 2018-03-30

## 2018-03-30 MED ORDER — OXYCODONE HCL 15 MG PO TABS
15.00 | ORAL_TABLET | ORAL | Status: DC
Start: ? — End: 2018-03-30

## 2018-03-30 MED ORDER — GENERIC EXTERNAL MEDICATION
4.00 | Status: DC
Start: ? — End: 2018-03-30

## 2018-03-30 MED ORDER — SENNA-DOCUSATE SODIUM 8.6-50 MG PO TABS
2.00 | ORAL_TABLET | ORAL | Status: DC
Start: 2018-03-29 — End: 2018-03-30

## 2018-03-30 MED ORDER — POLYETHYLENE GLYCOL 3350 17 G PO PACK
17.00 | PACK | ORAL | Status: DC
Start: ? — End: 2018-03-30

## 2018-03-30 MED ORDER — DIAZEPAM 5 MG PO TABS
10.00 | ORAL_TABLET | ORAL | Status: DC
Start: ? — End: 2018-03-30

## 2018-04-01 DIAGNOSIS — E119 Type 2 diabetes mellitus without complications: Secondary | ICD-10-CM | POA: Diagnosis not present

## 2018-04-01 DIAGNOSIS — Z4801 Encounter for change or removal of surgical wound dressing: Secondary | ICD-10-CM | POA: Diagnosis not present

## 2018-04-01 DIAGNOSIS — I10 Essential (primary) hypertension: Secondary | ICD-10-CM | POA: Diagnosis not present

## 2018-04-01 DIAGNOSIS — Z89511 Acquired absence of right leg below knee: Secondary | ICD-10-CM | POA: Diagnosis not present

## 2018-04-04 DIAGNOSIS — E119 Type 2 diabetes mellitus without complications: Secondary | ICD-10-CM | POA: Diagnosis not present

## 2018-04-04 DIAGNOSIS — Z4801 Encounter for change or removal of surgical wound dressing: Secondary | ICD-10-CM | POA: Diagnosis not present

## 2018-04-04 DIAGNOSIS — I10 Essential (primary) hypertension: Secondary | ICD-10-CM | POA: Diagnosis not present

## 2018-04-04 DIAGNOSIS — Z89511 Acquired absence of right leg below knee: Secondary | ICD-10-CM | POA: Diagnosis not present

## 2018-04-06 DIAGNOSIS — Z4801 Encounter for change or removal of surgical wound dressing: Secondary | ICD-10-CM | POA: Diagnosis not present

## 2018-04-06 DIAGNOSIS — E119 Type 2 diabetes mellitus without complications: Secondary | ICD-10-CM | POA: Diagnosis not present

## 2018-04-06 DIAGNOSIS — I10 Essential (primary) hypertension: Secondary | ICD-10-CM | POA: Diagnosis not present

## 2018-04-06 DIAGNOSIS — Z89511 Acquired absence of right leg below knee: Secondary | ICD-10-CM | POA: Diagnosis not present

## 2018-04-08 DIAGNOSIS — Z4801 Encounter for change or removal of surgical wound dressing: Secondary | ICD-10-CM | POA: Diagnosis not present

## 2018-04-08 DIAGNOSIS — I10 Essential (primary) hypertension: Secondary | ICD-10-CM | POA: Diagnosis not present

## 2018-04-08 DIAGNOSIS — E119 Type 2 diabetes mellitus without complications: Secondary | ICD-10-CM | POA: Diagnosis not present

## 2018-04-08 DIAGNOSIS — Z89511 Acquired absence of right leg below knee: Secondary | ICD-10-CM | POA: Diagnosis not present

## 2018-04-09 ENCOUNTER — Telehealth: Payer: Self-pay | Admitting: Family Medicine

## 2018-04-09 NOTE — Telephone Encounter (Signed)
I received a home health certification and plan of care to complete, but have not seen this patient since May 2018.  On review of his chart he did have surgery this past month, so may be difficult to return for OV soon. I have signed orders, but may also need to have a face to face office visit as over 1 year since last ov. Please schedule if possible. Thanls.

## 2018-04-11 DIAGNOSIS — Z4801 Encounter for change or removal of surgical wound dressing: Secondary | ICD-10-CM | POA: Diagnosis not present

## 2018-04-11 DIAGNOSIS — I10 Essential (primary) hypertension: Secondary | ICD-10-CM | POA: Diagnosis not present

## 2018-04-11 DIAGNOSIS — Z89511 Acquired absence of right leg below knee: Secondary | ICD-10-CM | POA: Diagnosis not present

## 2018-04-11 DIAGNOSIS — E119 Type 2 diabetes mellitus without complications: Secondary | ICD-10-CM | POA: Diagnosis not present

## 2018-04-12 DIAGNOSIS — Z79899 Other long term (current) drug therapy: Secondary | ICD-10-CM | POA: Diagnosis not present

## 2018-04-12 DIAGNOSIS — G894 Chronic pain syndrome: Secondary | ICD-10-CM | POA: Diagnosis not present

## 2018-04-12 DIAGNOSIS — M79609 Pain in unspecified limb: Secondary | ICD-10-CM | POA: Diagnosis not present

## 2018-04-12 DIAGNOSIS — T8789 Other complications of amputation stump: Secondary | ICD-10-CM | POA: Diagnosis not present

## 2018-04-13 DIAGNOSIS — Z4801 Encounter for change or removal of surgical wound dressing: Secondary | ICD-10-CM | POA: Diagnosis not present

## 2018-04-13 DIAGNOSIS — E119 Type 2 diabetes mellitus without complications: Secondary | ICD-10-CM | POA: Diagnosis not present

## 2018-04-13 DIAGNOSIS — I10 Essential (primary) hypertension: Secondary | ICD-10-CM | POA: Diagnosis not present

## 2018-04-13 DIAGNOSIS — Z89511 Acquired absence of right leg below knee: Secondary | ICD-10-CM | POA: Diagnosis not present

## 2018-04-15 DIAGNOSIS — Z89511 Acquired absence of right leg below knee: Secondary | ICD-10-CM | POA: Diagnosis not present

## 2018-04-15 DIAGNOSIS — Z4801 Encounter for change or removal of surgical wound dressing: Secondary | ICD-10-CM | POA: Diagnosis not present

## 2018-04-15 DIAGNOSIS — I10 Essential (primary) hypertension: Secondary | ICD-10-CM | POA: Diagnosis not present

## 2018-04-15 DIAGNOSIS — E119 Type 2 diabetes mellitus without complications: Secondary | ICD-10-CM | POA: Diagnosis not present

## 2018-04-16 NOTE — Telephone Encounter (Signed)
Message sent to Scheduling to call pt and schedule appt with Neva Seat when pt able.

## 2018-04-18 DIAGNOSIS — Z89511 Acquired absence of right leg below knee: Secondary | ICD-10-CM | POA: Diagnosis not present

## 2018-04-18 DIAGNOSIS — E119 Type 2 diabetes mellitus without complications: Secondary | ICD-10-CM | POA: Diagnosis not present

## 2018-04-18 DIAGNOSIS — Z4801 Encounter for change or removal of surgical wound dressing: Secondary | ICD-10-CM | POA: Diagnosis not present

## 2018-04-18 DIAGNOSIS — I10 Essential (primary) hypertension: Secondary | ICD-10-CM | POA: Diagnosis not present

## 2018-04-19 ENCOUNTER — Telehealth: Payer: Self-pay | Admitting: Family Medicine

## 2018-04-19 DIAGNOSIS — Z89511 Acquired absence of right leg below knee: Secondary | ICD-10-CM | POA: Diagnosis not present

## 2018-04-19 NOTE — Telephone Encounter (Signed)
Called pt per Dr. Thomasene Lot phone message to attempt to schedule pt. Sparing some details the pt was rather explicit in explaining he did not wish to see Dr. Neva Seat again.

## 2018-04-20 DIAGNOSIS — E119 Type 2 diabetes mellitus without complications: Secondary | ICD-10-CM | POA: Diagnosis not present

## 2018-04-20 DIAGNOSIS — Z4801 Encounter for change or removal of surgical wound dressing: Secondary | ICD-10-CM | POA: Diagnosis not present

## 2018-04-20 DIAGNOSIS — Z89511 Acquired absence of right leg below knee: Secondary | ICD-10-CM | POA: Diagnosis not present

## 2018-04-20 DIAGNOSIS — I10 Essential (primary) hypertension: Secondary | ICD-10-CM | POA: Diagnosis not present

## 2018-04-22 DIAGNOSIS — E119 Type 2 diabetes mellitus without complications: Secondary | ICD-10-CM | POA: Diagnosis not present

## 2018-04-22 DIAGNOSIS — I10 Essential (primary) hypertension: Secondary | ICD-10-CM | POA: Diagnosis not present

## 2018-04-22 DIAGNOSIS — Z89511 Acquired absence of right leg below knee: Secondary | ICD-10-CM | POA: Diagnosis not present

## 2018-04-22 DIAGNOSIS — Z4801 Encounter for change or removal of surgical wound dressing: Secondary | ICD-10-CM | POA: Diagnosis not present

## 2018-04-25 DIAGNOSIS — Z4801 Encounter for change or removal of surgical wound dressing: Secondary | ICD-10-CM | POA: Diagnosis not present

## 2018-04-25 DIAGNOSIS — I10 Essential (primary) hypertension: Secondary | ICD-10-CM | POA: Diagnosis not present

## 2018-04-25 DIAGNOSIS — Z89511 Acquired absence of right leg below knee: Secondary | ICD-10-CM | POA: Diagnosis not present

## 2018-04-25 DIAGNOSIS — E119 Type 2 diabetes mellitus without complications: Secondary | ICD-10-CM | POA: Diagnosis not present

## 2018-04-27 DIAGNOSIS — I10 Essential (primary) hypertension: Secondary | ICD-10-CM | POA: Diagnosis not present

## 2018-04-27 DIAGNOSIS — E119 Type 2 diabetes mellitus without complications: Secondary | ICD-10-CM | POA: Diagnosis not present

## 2018-04-27 DIAGNOSIS — Z89511 Acquired absence of right leg below knee: Secondary | ICD-10-CM | POA: Diagnosis not present

## 2018-04-27 DIAGNOSIS — Z4801 Encounter for change or removal of surgical wound dressing: Secondary | ICD-10-CM | POA: Diagnosis not present

## 2018-04-29 DIAGNOSIS — E119 Type 2 diabetes mellitus without complications: Secondary | ICD-10-CM | POA: Diagnosis not present

## 2018-04-29 DIAGNOSIS — Z89511 Acquired absence of right leg below knee: Secondary | ICD-10-CM | POA: Diagnosis not present

## 2018-04-29 DIAGNOSIS — Z4801 Encounter for change or removal of surgical wound dressing: Secondary | ICD-10-CM | POA: Diagnosis not present

## 2018-04-29 DIAGNOSIS — I10 Essential (primary) hypertension: Secondary | ICD-10-CM | POA: Diagnosis not present

## 2018-05-02 DIAGNOSIS — E119 Type 2 diabetes mellitus without complications: Secondary | ICD-10-CM | POA: Diagnosis not present

## 2018-05-02 DIAGNOSIS — I10 Essential (primary) hypertension: Secondary | ICD-10-CM | POA: Diagnosis not present

## 2018-05-02 DIAGNOSIS — Z4801 Encounter for change or removal of surgical wound dressing: Secondary | ICD-10-CM | POA: Diagnosis not present

## 2018-05-02 DIAGNOSIS — Z89511 Acquired absence of right leg below knee: Secondary | ICD-10-CM | POA: Diagnosis not present

## 2018-05-04 DIAGNOSIS — Z4801 Encounter for change or removal of surgical wound dressing: Secondary | ICD-10-CM | POA: Diagnosis not present

## 2018-05-04 DIAGNOSIS — I10 Essential (primary) hypertension: Secondary | ICD-10-CM | POA: Diagnosis not present

## 2018-05-04 DIAGNOSIS — Z89511 Acquired absence of right leg below knee: Secondary | ICD-10-CM | POA: Diagnosis not present

## 2018-05-04 DIAGNOSIS — E119 Type 2 diabetes mellitus without complications: Secondary | ICD-10-CM | POA: Diagnosis not present

## 2018-05-06 DIAGNOSIS — Z4801 Encounter for change or removal of surgical wound dressing: Secondary | ICD-10-CM | POA: Diagnosis not present

## 2018-05-06 DIAGNOSIS — I10 Essential (primary) hypertension: Secondary | ICD-10-CM | POA: Diagnosis not present

## 2018-05-06 DIAGNOSIS — E119 Type 2 diabetes mellitus without complications: Secondary | ICD-10-CM | POA: Diagnosis not present

## 2018-05-06 DIAGNOSIS — Z89511 Acquired absence of right leg below knee: Secondary | ICD-10-CM | POA: Diagnosis not present

## 2018-05-09 DIAGNOSIS — Z89511 Acquired absence of right leg below knee: Secondary | ICD-10-CM | POA: Diagnosis not present

## 2018-05-09 DIAGNOSIS — I10 Essential (primary) hypertension: Secondary | ICD-10-CM | POA: Diagnosis not present

## 2018-05-09 DIAGNOSIS — Z4801 Encounter for change or removal of surgical wound dressing: Secondary | ICD-10-CM | POA: Diagnosis not present

## 2018-05-09 DIAGNOSIS — E119 Type 2 diabetes mellitus without complications: Secondary | ICD-10-CM | POA: Diagnosis not present

## 2018-05-11 DIAGNOSIS — E119 Type 2 diabetes mellitus without complications: Secondary | ICD-10-CM | POA: Diagnosis not present

## 2018-05-11 DIAGNOSIS — I10 Essential (primary) hypertension: Secondary | ICD-10-CM | POA: Diagnosis not present

## 2018-05-11 DIAGNOSIS — Z4801 Encounter for change or removal of surgical wound dressing: Secondary | ICD-10-CM | POA: Diagnosis not present

## 2018-05-11 DIAGNOSIS — Z89511 Acquired absence of right leg below knee: Secondary | ICD-10-CM | POA: Diagnosis not present

## 2018-05-12 DIAGNOSIS — T879 Unspecified complications of amputation stump: Secondary | ICD-10-CM | POA: Diagnosis not present

## 2018-05-12 DIAGNOSIS — Z23 Encounter for immunization: Secondary | ICD-10-CM | POA: Diagnosis not present

## 2018-05-12 DIAGNOSIS — Z79899 Other long term (current) drug therapy: Secondary | ICD-10-CM | POA: Diagnosis not present

## 2018-05-12 DIAGNOSIS — G894 Chronic pain syndrome: Secondary | ICD-10-CM | POA: Diagnosis not present

## 2018-05-12 DIAGNOSIS — Z48812 Encounter for surgical aftercare following surgery on the circulatory system: Secondary | ICD-10-CM | POA: Diagnosis not present

## 2018-05-12 DIAGNOSIS — L98499 Non-pressure chronic ulcer of skin of other sites with unspecified severity: Secondary | ICD-10-CM | POA: Diagnosis not present

## 2018-05-13 DIAGNOSIS — I10 Essential (primary) hypertension: Secondary | ICD-10-CM | POA: Diagnosis not present

## 2018-05-13 DIAGNOSIS — Z4801 Encounter for change or removal of surgical wound dressing: Secondary | ICD-10-CM | POA: Diagnosis not present

## 2018-05-13 DIAGNOSIS — Z89511 Acquired absence of right leg below knee: Secondary | ICD-10-CM | POA: Diagnosis not present

## 2018-05-13 DIAGNOSIS — E119 Type 2 diabetes mellitus without complications: Secondary | ICD-10-CM | POA: Diagnosis not present

## 2018-05-16 DIAGNOSIS — Z89511 Acquired absence of right leg below knee: Secondary | ICD-10-CM | POA: Diagnosis not present

## 2018-05-16 DIAGNOSIS — I10 Essential (primary) hypertension: Secondary | ICD-10-CM | POA: Diagnosis not present

## 2018-05-16 DIAGNOSIS — E119 Type 2 diabetes mellitus without complications: Secondary | ICD-10-CM | POA: Diagnosis not present

## 2018-05-16 DIAGNOSIS — Z4801 Encounter for change or removal of surgical wound dressing: Secondary | ICD-10-CM | POA: Diagnosis not present

## 2018-05-18 DIAGNOSIS — Z4801 Encounter for change or removal of surgical wound dressing: Secondary | ICD-10-CM | POA: Diagnosis not present

## 2018-05-18 DIAGNOSIS — I10 Essential (primary) hypertension: Secondary | ICD-10-CM | POA: Diagnosis not present

## 2018-05-18 DIAGNOSIS — Z89511 Acquired absence of right leg below knee: Secondary | ICD-10-CM | POA: Diagnosis not present

## 2018-05-18 DIAGNOSIS — E119 Type 2 diabetes mellitus without complications: Secondary | ICD-10-CM | POA: Diagnosis not present

## 2018-05-20 DIAGNOSIS — I10 Essential (primary) hypertension: Secondary | ICD-10-CM | POA: Diagnosis not present

## 2018-05-20 DIAGNOSIS — Z89511 Acquired absence of right leg below knee: Secondary | ICD-10-CM | POA: Diagnosis not present

## 2018-05-20 DIAGNOSIS — Z4801 Encounter for change or removal of surgical wound dressing: Secondary | ICD-10-CM | POA: Diagnosis not present

## 2018-05-20 DIAGNOSIS — E119 Type 2 diabetes mellitus without complications: Secondary | ICD-10-CM | POA: Diagnosis not present

## 2018-05-23 DIAGNOSIS — E119 Type 2 diabetes mellitus without complications: Secondary | ICD-10-CM | POA: Diagnosis not present

## 2018-05-23 DIAGNOSIS — Z89511 Acquired absence of right leg below knee: Secondary | ICD-10-CM | POA: Diagnosis not present

## 2018-05-23 DIAGNOSIS — Z4801 Encounter for change or removal of surgical wound dressing: Secondary | ICD-10-CM | POA: Diagnosis not present

## 2018-05-23 DIAGNOSIS — I10 Essential (primary) hypertension: Secondary | ICD-10-CM | POA: Diagnosis not present

## 2018-05-25 DIAGNOSIS — Z89511 Acquired absence of right leg below knee: Secondary | ICD-10-CM | POA: Diagnosis not present

## 2018-05-25 DIAGNOSIS — Z4801 Encounter for change or removal of surgical wound dressing: Secondary | ICD-10-CM | POA: Diagnosis not present

## 2018-05-25 DIAGNOSIS — E119 Type 2 diabetes mellitus without complications: Secondary | ICD-10-CM | POA: Diagnosis not present

## 2018-05-25 DIAGNOSIS — I10 Essential (primary) hypertension: Secondary | ICD-10-CM | POA: Diagnosis not present

## 2018-05-30 DIAGNOSIS — E119 Type 2 diabetes mellitus without complications: Secondary | ICD-10-CM | POA: Diagnosis not present

## 2018-05-30 DIAGNOSIS — Z89511 Acquired absence of right leg below knee: Secondary | ICD-10-CM | POA: Diagnosis not present

## 2018-05-30 DIAGNOSIS — I10 Essential (primary) hypertension: Secondary | ICD-10-CM | POA: Diagnosis not present

## 2018-05-30 DIAGNOSIS — Z4801 Encounter for change or removal of surgical wound dressing: Secondary | ICD-10-CM | POA: Diagnosis not present

## 2018-06-03 DIAGNOSIS — Z89511 Acquired absence of right leg below knee: Secondary | ICD-10-CM | POA: Diagnosis not present

## 2018-06-03 DIAGNOSIS — Z4801 Encounter for change or removal of surgical wound dressing: Secondary | ICD-10-CM | POA: Diagnosis not present

## 2018-06-03 DIAGNOSIS — E119 Type 2 diabetes mellitus without complications: Secondary | ICD-10-CM | POA: Diagnosis not present

## 2018-06-03 DIAGNOSIS — I10 Essential (primary) hypertension: Secondary | ICD-10-CM | POA: Diagnosis not present

## 2018-06-06 DIAGNOSIS — E119 Type 2 diabetes mellitus without complications: Secondary | ICD-10-CM | POA: Diagnosis not present

## 2018-06-06 DIAGNOSIS — I10 Essential (primary) hypertension: Secondary | ICD-10-CM | POA: Diagnosis not present

## 2018-06-06 DIAGNOSIS — Z89511 Acquired absence of right leg below knee: Secondary | ICD-10-CM | POA: Diagnosis not present

## 2018-06-06 DIAGNOSIS — Z4801 Encounter for change or removal of surgical wound dressing: Secondary | ICD-10-CM | POA: Diagnosis not present

## 2018-06-09 DIAGNOSIS — G894 Chronic pain syndrome: Secondary | ICD-10-CM | POA: Diagnosis not present

## 2018-06-09 DIAGNOSIS — Z79899 Other long term (current) drug therapy: Secondary | ICD-10-CM | POA: Diagnosis not present

## 2018-06-09 DIAGNOSIS — M79609 Pain in unspecified limb: Secondary | ICD-10-CM | POA: Diagnosis not present

## 2018-06-09 DIAGNOSIS — T8789 Other complications of amputation stump: Secondary | ICD-10-CM | POA: Diagnosis not present

## 2018-06-09 DIAGNOSIS — L98499 Non-pressure chronic ulcer of skin of other sites with unspecified severity: Secondary | ICD-10-CM | POA: Diagnosis not present

## 2018-06-10 DIAGNOSIS — Z4801 Encounter for change or removal of surgical wound dressing: Secondary | ICD-10-CM | POA: Diagnosis not present

## 2018-06-10 DIAGNOSIS — Z89511 Acquired absence of right leg below knee: Secondary | ICD-10-CM | POA: Diagnosis not present

## 2018-06-10 DIAGNOSIS — I10 Essential (primary) hypertension: Secondary | ICD-10-CM | POA: Diagnosis not present

## 2018-06-10 DIAGNOSIS — E119 Type 2 diabetes mellitus without complications: Secondary | ICD-10-CM | POA: Diagnosis not present

## 2018-06-13 DIAGNOSIS — Z89511 Acquired absence of right leg below knee: Secondary | ICD-10-CM | POA: Diagnosis not present

## 2018-06-13 DIAGNOSIS — Z4801 Encounter for change or removal of surgical wound dressing: Secondary | ICD-10-CM | POA: Diagnosis not present

## 2018-06-13 DIAGNOSIS — E119 Type 2 diabetes mellitus without complications: Secondary | ICD-10-CM | POA: Diagnosis not present

## 2018-06-13 DIAGNOSIS — I10 Essential (primary) hypertension: Secondary | ICD-10-CM | POA: Diagnosis not present

## 2018-06-17 DIAGNOSIS — Z89511 Acquired absence of right leg below knee: Secondary | ICD-10-CM | POA: Diagnosis not present

## 2018-06-17 DIAGNOSIS — I10 Essential (primary) hypertension: Secondary | ICD-10-CM | POA: Diagnosis not present

## 2018-06-17 DIAGNOSIS — Z4801 Encounter for change or removal of surgical wound dressing: Secondary | ICD-10-CM | POA: Diagnosis not present

## 2018-06-17 DIAGNOSIS — E119 Type 2 diabetes mellitus without complications: Secondary | ICD-10-CM | POA: Diagnosis not present

## 2018-06-20 DIAGNOSIS — E119 Type 2 diabetes mellitus without complications: Secondary | ICD-10-CM | POA: Diagnosis not present

## 2018-06-20 DIAGNOSIS — I10 Essential (primary) hypertension: Secondary | ICD-10-CM | POA: Diagnosis not present

## 2018-06-20 DIAGNOSIS — Z89511 Acquired absence of right leg below knee: Secondary | ICD-10-CM | POA: Diagnosis not present

## 2018-06-20 DIAGNOSIS — Z4801 Encounter for change or removal of surgical wound dressing: Secondary | ICD-10-CM | POA: Diagnosis not present

## 2018-06-24 DIAGNOSIS — E119 Type 2 diabetes mellitus without complications: Secondary | ICD-10-CM | POA: Diagnosis not present

## 2018-06-24 DIAGNOSIS — I10 Essential (primary) hypertension: Secondary | ICD-10-CM | POA: Diagnosis not present

## 2018-06-24 DIAGNOSIS — Z4801 Encounter for change or removal of surgical wound dressing: Secondary | ICD-10-CM | POA: Diagnosis not present

## 2018-06-24 DIAGNOSIS — Z89511 Acquired absence of right leg below knee: Secondary | ICD-10-CM | POA: Diagnosis not present

## 2018-07-01 DIAGNOSIS — E119 Type 2 diabetes mellitus without complications: Secondary | ICD-10-CM | POA: Diagnosis not present

## 2018-07-01 DIAGNOSIS — Z89511 Acquired absence of right leg below knee: Secondary | ICD-10-CM | POA: Diagnosis not present

## 2018-07-01 DIAGNOSIS — I10 Essential (primary) hypertension: Secondary | ICD-10-CM | POA: Diagnosis not present

## 2018-07-01 DIAGNOSIS — Z4801 Encounter for change or removal of surgical wound dressing: Secondary | ICD-10-CM | POA: Diagnosis not present

## 2018-07-04 DIAGNOSIS — Z89511 Acquired absence of right leg below knee: Secondary | ICD-10-CM | POA: Diagnosis not present

## 2018-07-04 DIAGNOSIS — I10 Essential (primary) hypertension: Secondary | ICD-10-CM | POA: Diagnosis not present

## 2018-07-04 DIAGNOSIS — Z4801 Encounter for change or removal of surgical wound dressing: Secondary | ICD-10-CM | POA: Diagnosis not present

## 2018-07-04 DIAGNOSIS — E119 Type 2 diabetes mellitus without complications: Secondary | ICD-10-CM | POA: Diagnosis not present

## 2018-07-06 DIAGNOSIS — G546 Phantom limb syndrome with pain: Secondary | ICD-10-CM | POA: Diagnosis not present

## 2018-07-06 DIAGNOSIS — M79604 Pain in right leg: Secondary | ICD-10-CM | POA: Diagnosis not present

## 2018-07-06 DIAGNOSIS — G894 Chronic pain syndrome: Secondary | ICD-10-CM | POA: Diagnosis not present

## 2018-07-06 DIAGNOSIS — T8789 Other complications of amputation stump: Secondary | ICD-10-CM | POA: Diagnosis not present

## 2018-07-11 DIAGNOSIS — Z89511 Acquired absence of right leg below knee: Secondary | ICD-10-CM | POA: Diagnosis not present

## 2018-07-11 DIAGNOSIS — Z4801 Encounter for change or removal of surgical wound dressing: Secondary | ICD-10-CM | POA: Diagnosis not present

## 2018-07-11 DIAGNOSIS — E119 Type 2 diabetes mellitus without complications: Secondary | ICD-10-CM | POA: Diagnosis not present

## 2018-07-11 DIAGNOSIS — I10 Essential (primary) hypertension: Secondary | ICD-10-CM | POA: Diagnosis not present

## 2018-07-18 DIAGNOSIS — Z4801 Encounter for change or removal of surgical wound dressing: Secondary | ICD-10-CM | POA: Diagnosis not present

## 2018-07-18 DIAGNOSIS — I10 Essential (primary) hypertension: Secondary | ICD-10-CM | POA: Diagnosis not present

## 2018-07-18 DIAGNOSIS — E119 Type 2 diabetes mellitus without complications: Secondary | ICD-10-CM | POA: Diagnosis not present

## 2018-07-18 DIAGNOSIS — Z89511 Acquired absence of right leg below knee: Secondary | ICD-10-CM | POA: Diagnosis not present

## 2018-07-22 ENCOUNTER — Other Ambulatory Visit: Payer: Self-pay | Admitting: Family Medicine

## 2018-07-22 DIAGNOSIS — Z4801 Encounter for change or removal of surgical wound dressing: Secondary | ICD-10-CM | POA: Diagnosis not present

## 2018-07-22 DIAGNOSIS — I10 Essential (primary) hypertension: Secondary | ICD-10-CM | POA: Diagnosis not present

## 2018-07-22 DIAGNOSIS — Z89511 Acquired absence of right leg below knee: Secondary | ICD-10-CM | POA: Diagnosis not present

## 2018-07-22 DIAGNOSIS — E119 Type 2 diabetes mellitus without complications: Secondary | ICD-10-CM | POA: Diagnosis not present

## 2018-07-22 NOTE — Telephone Encounter (Signed)
Patient called and advised he will need to schedule with another provider, if he wants to stay at [facility]. He says he will be finding another doctor somewhere else.

## 2018-08-22 DIAGNOSIS — Z89511 Acquired absence of right leg below knee: Secondary | ICD-10-CM | POA: Diagnosis not present

## 2018-09-02 DIAGNOSIS — G894 Chronic pain syndrome: Secondary | ICD-10-CM | POA: Diagnosis not present

## 2018-09-02 DIAGNOSIS — T8789 Other complications of amputation stump: Secondary | ICD-10-CM | POA: Diagnosis not present

## 2018-09-02 DIAGNOSIS — Z79899 Other long term (current) drug therapy: Secondary | ICD-10-CM | POA: Diagnosis not present

## 2018-09-02 DIAGNOSIS — M79609 Pain in unspecified limb: Secondary | ICD-10-CM | POA: Diagnosis not present

## 2018-09-19 DIAGNOSIS — E1165 Type 2 diabetes mellitus with hyperglycemia: Secondary | ICD-10-CM | POA: Diagnosis not present

## 2018-09-19 DIAGNOSIS — Z79899 Other long term (current) drug therapy: Secondary | ICD-10-CM | POA: Diagnosis not present

## 2018-09-19 DIAGNOSIS — E559 Vitamin D deficiency, unspecified: Secondary | ICD-10-CM | POA: Diagnosis not present

## 2018-09-19 DIAGNOSIS — E7800 Pure hypercholesterolemia, unspecified: Secondary | ICD-10-CM | POA: Diagnosis not present

## 2018-09-22 DIAGNOSIS — Z89511 Acquired absence of right leg below knee: Secondary | ICD-10-CM | POA: Diagnosis not present

## 2018-10-03 DIAGNOSIS — M5136 Other intervertebral disc degeneration, lumbar region: Secondary | ICD-10-CM | POA: Diagnosis not present

## 2018-10-03 DIAGNOSIS — G546 Phantom limb syndrome with pain: Secondary | ICD-10-CM | POA: Diagnosis not present

## 2018-10-03 DIAGNOSIS — G894 Chronic pain syndrome: Secondary | ICD-10-CM | POA: Diagnosis not present

## 2018-10-19 DIAGNOSIS — E7800 Pure hypercholesterolemia, unspecified: Secondary | ICD-10-CM | POA: Diagnosis not present

## 2018-10-19 DIAGNOSIS — D539 Nutritional anemia, unspecified: Secondary | ICD-10-CM | POA: Diagnosis not present

## 2018-10-19 DIAGNOSIS — Z0000 Encounter for general adult medical examination without abnormal findings: Secondary | ICD-10-CM | POA: Diagnosis not present

## 2018-10-19 DIAGNOSIS — R5383 Other fatigue: Secondary | ICD-10-CM | POA: Diagnosis not present

## 2018-10-19 DIAGNOSIS — E1165 Type 2 diabetes mellitus with hyperglycemia: Secondary | ICD-10-CM | POA: Diagnosis not present

## 2018-10-19 DIAGNOSIS — E559 Vitamin D deficiency, unspecified: Secondary | ICD-10-CM | POA: Diagnosis not present

## 2018-10-19 DIAGNOSIS — M129 Arthropathy, unspecified: Secondary | ICD-10-CM | POA: Diagnosis not present

## 2018-10-31 DIAGNOSIS — Z79899 Other long term (current) drug therapy: Secondary | ICD-10-CM | POA: Diagnosis not present

## 2018-10-31 DIAGNOSIS — G894 Chronic pain syndrome: Secondary | ICD-10-CM | POA: Diagnosis not present

## 2018-10-31 DIAGNOSIS — G546 Phantom limb syndrome with pain: Secondary | ICD-10-CM | POA: Diagnosis not present

## 2018-12-01 DIAGNOSIS — M79609 Pain in unspecified limb: Secondary | ICD-10-CM | POA: Diagnosis not present

## 2018-12-01 DIAGNOSIS — Z79899 Other long term (current) drug therapy: Secondary | ICD-10-CM | POA: Diagnosis not present

## 2018-12-01 DIAGNOSIS — T8789 Other complications of amputation stump: Secondary | ICD-10-CM | POA: Diagnosis not present

## 2018-12-01 DIAGNOSIS — G546 Phantom limb syndrome with pain: Secondary | ICD-10-CM | POA: Diagnosis not present

## 2018-12-30 DIAGNOSIS — G546 Phantom limb syndrome with pain: Secondary | ICD-10-CM | POA: Diagnosis not present

## 2018-12-30 DIAGNOSIS — G894 Chronic pain syndrome: Secondary | ICD-10-CM | POA: Diagnosis not present

## 2018-12-30 DIAGNOSIS — M79609 Pain in unspecified limb: Secondary | ICD-10-CM | POA: Diagnosis not present

## 2018-12-30 DIAGNOSIS — T8789 Other complications of amputation stump: Secondary | ICD-10-CM | POA: Diagnosis not present

## 2018-12-30 DIAGNOSIS — Z79899 Other long term (current) drug therapy: Secondary | ICD-10-CM | POA: Diagnosis not present

## 2019-01-06 ENCOUNTER — Other Ambulatory Visit: Payer: Self-pay | Admitting: Family Medicine

## 2019-01-06 DIAGNOSIS — E1142 Type 2 diabetes mellitus with diabetic polyneuropathy: Secondary | ICD-10-CM

## 2019-01-06 NOTE — Telephone Encounter (Signed)
Requested medication (s) are due for refill today: yes  Requested medication (s) are on the active medication list: yes  Last refill:  09/08/16  Future visit scheduled:No  Notes to clinic:  Has not been seen in 2 years    Requested Prescriptions  Pending Prescriptions Disp Refills   Accu-Chek FastClix Lancets MISC [Pharmacy Med Name: LANCET   FASTCLIX] 102 each     Sig: USE TO TEST GLUCOSE ONCE A  DAY     Endocrinology: Diabetes - Testing Supplies Failed - 01/06/2019 12:12 PM      Failed - Valid encounter within last 12 months    Recent Outpatient Visits          2 years ago Depression, unspecified depression type   [facility] at [facility], Asencion Partridge, MD   2 years ago Encounter for medication management   Primary Care at [facility], Madelaine Bhat, PA-C   2 years ago Right foot ulcer, limited to breakdown of skin [facility])   Primary Care at [facility], Asencion Partridge, MD   2 years ago Biceps muscle strain, left, initial encounter   [facility] at [facility], MD   3 years ago Skin lesion   [facility] at [facility], MD            ACCU-CHEK SMARTVIEW test strip [facility] Med Name: T Cheri Kearns ACCU CHK SMARTVIEW STP] 100 each     Sig: USE 1 TEST STRIP TO CHECK  GLUCOSE ONCE A DAY     Endocrinology: Diabetes - Testing Supplies Failed - 01/06/2019 12:12 PM      Failed - Valid encounter within last 12 months    Recent Outpatient Visits          2 years ago Depression, unspecified depression type   [facility] at [facility], Asencion Partridge, MD   2 years ago Encounter for medication management   Primary Care at [facility], Madelaine Bhat, PA-C   2 years ago Right foot ulcer, limited to breakdown of skin [facility])   Primary Care at [facility], Asencion Partridge, MD   2 years ago Biceps muscle strain, left, initial encounter   [facility] at [facility], MD   3 years ago Skin lesion   [facility] at [facility], MD

## 2019-01-31 DIAGNOSIS — Z79899 Other long term (current) drug therapy: Secondary | ICD-10-CM | POA: Diagnosis not present

## 2019-01-31 DIAGNOSIS — G894 Chronic pain syndrome: Secondary | ICD-10-CM | POA: Diagnosis not present

## 2019-01-31 DIAGNOSIS — G546 Phantom limb syndrome with pain: Secondary | ICD-10-CM | POA: Diagnosis not present

## 2019-02-14 NOTE — Progress Notes (Signed)
This encounter was created in error - please disregard.

## 2019-02-15 ENCOUNTER — Other Ambulatory Visit: Payer: Self-pay

## 2019-02-15 NOTE — Patient Outreach (Addendum)
[facility]) Care Management  02/15/2019  [name] [birth year] [id number]    Received referral on 02-01-2019 from CMA to follow up with member for complex CM due to HTN.  Called member at preferred number and member answered phone. Introduced self and explained reason for the call. HIPPA identifiers verified.   Explained that [facility] care coordination services, connection to local community resources and personal assistance in managing member's healthcare needs are provided to member without cost. Additional benefits explained as well.   Member stated that he currently does not have a PCP and he only goes to [facility] and stated "I refuse to go to [facility]. I lost my leg because of stupid doctors". Member states he is married and he has an Therapist, sports that will come to his home a needed.  Member stated he has a lot of things going on for the next 2 weeks and agreed to call back at end of the month to complete initial assessment.   Will send Successful Outreach Letter and Welcome Letter. Will call member at end of month.   [name] [name], RN-BSN  [facility] Care Management  Community Care Management Coordinator  [phone] [email or web address]

## 2019-03-01 DIAGNOSIS — G894 Chronic pain syndrome: Secondary | ICD-10-CM | POA: Diagnosis not present

## 2019-03-01 DIAGNOSIS — Z79899 Other long term (current) drug therapy: Secondary | ICD-10-CM | POA: Diagnosis not present

## 2019-03-01 DIAGNOSIS — G546 Phantom limb syndrome with pain: Secondary | ICD-10-CM | POA: Diagnosis not present

## 2019-03-01 DIAGNOSIS — Z760 Encounter for issue of repeat prescription: Secondary | ICD-10-CM | POA: Diagnosis not present

## 2019-03-07 ENCOUNTER — Other Ambulatory Visit: Payer: Self-pay

## 2019-03-07 NOTE — Patient Outreach (Signed)
[place]) Care Management  03/07/2019  [name] 1956-01-04 [id number]   Unsuccessful Outreach x 1.  Called member at preferred number in order to complete initial assessment and member answered phone. Introduced self and explained reason for the call. HIPPA identifiers verified.   Member stated that he is unavailable at this time due to "I am getting a haircut right now". Asked member for a better time to call and member stated "you can try tomorrow".   Will call member tomorrow as requested.   [name] [name], RN-BSN  [facility] Care Management  Community Care Management Coordinator  [phone] [email or web address]

## 2019-03-08 ENCOUNTER — Other Ambulatory Visit: Payer: Self-pay

## 2019-03-08 NOTE — Patient Outreach (Signed)
[place] [facility]) Care Management  03/08/2019  [name] 14-Oct-1955 [id number]   Unsuccessful Outreach x2   Called member at preferred number and secondary number to complete initial assessment and no answer. HIPPA compliant voice mail message left introducing self and requested return call when available.   Will send unsuccessful outreach letter. Will call member within 3-4 business days.[name] [name], RN-BSN  [facility] Care Management  Community Care Management Coordinator  [phone] [email or web address]

## 2019-03-13 ENCOUNTER — Other Ambulatory Visit: Payer: Self-pay

## 2019-03-13 NOTE — Patient Outreach (Signed)
[place] [facility]) Care Management  03/13/2019  [name] 29-Nov-1955 [id number]   Unsuccessful Outreach x 3.   Received referral on 02-01-2019 from CMA to follow up with member for complex CM due to HTN.  Called member at preferred number and no answer. HIPPA compliant voice mail message left introducing self and requested return call when available. Called member's secondary number and person answering stated "this is not Mr. Grigg's phone, thank you" and call was discontinued.  Will await return call from member. Will place on 10 day hold per [facility] workflow.  [name] [name], RN-BSN  [facility] Care Management  Community Care Management Coordinator  [phone] [email or web address]

## 2019-03-21 ENCOUNTER — Other Ambulatory Visit: Payer: Self-pay

## 2019-03-21 NOTE — Patient Outreach (Signed)
[place] [facility] [facility]) Care Management  03/21/2019  [name] [birth year] [id number]   Case Closure:  Multiple unsuccessful outreach attempts to member. Will close case due inability to maintain contact with member.   Will send Physician case closure letter. No other [facility] Care Team Member's involved at this time.  [name] [name], RN-BSN  [facility] Care Management  Community Care Management Coordinator  [phone] [email or web address]

## 2019-03-30 DIAGNOSIS — E559 Vitamin D deficiency, unspecified: Secondary | ICD-10-CM | POA: Diagnosis not present

## 2019-03-30 DIAGNOSIS — Z79899 Other long term (current) drug therapy: Secondary | ICD-10-CM | POA: Diagnosis not present

## 2019-03-30 DIAGNOSIS — E1165 Type 2 diabetes mellitus with hyperglycemia: Secondary | ICD-10-CM | POA: Diagnosis not present

## 2019-03-30 DIAGNOSIS — E7800 Pure hypercholesterolemia, unspecified: Secondary | ICD-10-CM | POA: Diagnosis not present

## 2019-03-30 DIAGNOSIS — G546 Phantom limb syndrome with pain: Secondary | ICD-10-CM | POA: Diagnosis not present

## 2019-03-30 DIAGNOSIS — Z1159 Encounter for screening for other viral diseases: Secondary | ICD-10-CM | POA: Diagnosis not present

## 2019-03-30 DIAGNOSIS — E039 Hypothyroidism, unspecified: Secondary | ICD-10-CM | POA: Diagnosis not present

## 2019-04-27 DIAGNOSIS — M5136 Other intervertebral disc degeneration, lumbar region: Secondary | ICD-10-CM | POA: Diagnosis not present

## 2019-04-27 DIAGNOSIS — Z79899 Other long term (current) drug therapy: Secondary | ICD-10-CM | POA: Diagnosis not present

## 2019-04-27 DIAGNOSIS — Z23 Encounter for immunization: Secondary | ICD-10-CM | POA: Diagnosis not present

## 2019-04-27 DIAGNOSIS — T8789 Other complications of amputation stump: Secondary | ICD-10-CM | POA: Diagnosis not present

## 2019-04-27 DIAGNOSIS — M79609 Pain in unspecified limb: Secondary | ICD-10-CM | POA: Diagnosis not present

## 2019-04-27 DIAGNOSIS — G894 Chronic pain syndrome: Secondary | ICD-10-CM | POA: Diagnosis not present

## 2019-05-04 DIAGNOSIS — Z89511 Acquired absence of right leg below knee: Secondary | ICD-10-CM | POA: Diagnosis not present

## 2019-05-13 ENCOUNTER — Other Ambulatory Visit: Payer: Self-pay | Admitting: Family Medicine

## 2019-05-13 DIAGNOSIS — E1142 Type 2 diabetes mellitus with diabetic polyneuropathy: Secondary | ICD-10-CM

## 2019-05-13 NOTE — Telephone Encounter (Signed)
Forwarding medication refill request to the clinical pool for review. 

## 2019-05-26 DIAGNOSIS — Z79899 Other long term (current) drug therapy: Secondary | ICD-10-CM | POA: Diagnosis not present

## 2019-05-26 DIAGNOSIS — M5136 Other intervertebral disc degeneration, lumbar region: Secondary | ICD-10-CM | POA: Diagnosis not present

## 2019-05-26 DIAGNOSIS — Z7189 Other specified counseling: Secondary | ICD-10-CM | POA: Diagnosis not present

## 2019-05-26 DIAGNOSIS — G894 Chronic pain syndrome: Secondary | ICD-10-CM | POA: Diagnosis not present

## 2019-06-26 DIAGNOSIS — M5136 Other intervertebral disc degeneration, lumbar region: Secondary | ICD-10-CM | POA: Diagnosis not present

## 2019-06-26 DIAGNOSIS — Z79899 Other long term (current) drug therapy: Secondary | ICD-10-CM | POA: Diagnosis not present

## 2019-06-26 DIAGNOSIS — G894 Chronic pain syndrome: Secondary | ICD-10-CM | POA: Diagnosis not present

## 2019-07-19 DIAGNOSIS — Z89432 Acquired absence of left foot: Secondary | ICD-10-CM | POA: Diagnosis not present

## 2019-07-19 DIAGNOSIS — L97422 Non-pressure chronic ulcer of left heel and midfoot with fat layer exposed: Secondary | ICD-10-CM | POA: Diagnosis not present

## 2019-07-19 DIAGNOSIS — E11621 Type 2 diabetes mellitus with foot ulcer: Secondary | ICD-10-CM | POA: Diagnosis not present

## 2019-07-21 DIAGNOSIS — Z4801 Encounter for change or removal of surgical wound dressing: Secondary | ICD-10-CM | POA: Diagnosis not present

## 2019-07-21 DIAGNOSIS — E1151 Type 2 diabetes mellitus with diabetic peripheral angiopathy without gangrene: Secondary | ICD-10-CM | POA: Diagnosis not present

## 2019-07-21 DIAGNOSIS — I10 Essential (primary) hypertension: Secondary | ICD-10-CM | POA: Diagnosis not present

## 2019-07-21 DIAGNOSIS — T8189XD Other complications of procedures, not elsewhere classified, subsequent encounter: Secondary | ICD-10-CM | POA: Diagnosis not present

## 2019-07-25 DIAGNOSIS — Z89432 Acquired absence of left foot: Secondary | ICD-10-CM | POA: Diagnosis not present

## 2019-07-25 DIAGNOSIS — S91102A Unspecified open wound of left great toe without damage to nail, initial encounter: Secondary | ICD-10-CM | POA: Diagnosis not present

## 2019-07-26 DIAGNOSIS — E1151 Type 2 diabetes mellitus with diabetic peripheral angiopathy without gangrene: Secondary | ICD-10-CM | POA: Diagnosis not present

## 2019-07-26 DIAGNOSIS — G546 Phantom limb syndrome with pain: Secondary | ICD-10-CM | POA: Diagnosis not present

## 2019-07-26 DIAGNOSIS — Z4801 Encounter for change or removal of surgical wound dressing: Secondary | ICD-10-CM | POA: Diagnosis not present

## 2019-07-26 DIAGNOSIS — T8189XD Other complications of procedures, not elsewhere classified, subsequent encounter: Secondary | ICD-10-CM | POA: Diagnosis not present

## 2019-07-26 DIAGNOSIS — Z9189 Other specified personal risk factors, not elsewhere classified: Secondary | ICD-10-CM | POA: Diagnosis not present

## 2019-07-26 DIAGNOSIS — G894 Chronic pain syndrome: Secondary | ICD-10-CM | POA: Diagnosis not present

## 2019-07-26 DIAGNOSIS — I10 Essential (primary) hypertension: Secondary | ICD-10-CM | POA: Diagnosis not present

## 2019-07-26 DIAGNOSIS — Z79899 Other long term (current) drug therapy: Secondary | ICD-10-CM | POA: Diagnosis not present

## 2019-07-28 DIAGNOSIS — I10 Essential (primary) hypertension: Secondary | ICD-10-CM | POA: Diagnosis not present

## 2019-07-28 DIAGNOSIS — T8189XD Other complications of procedures, not elsewhere classified, subsequent encounter: Secondary | ICD-10-CM | POA: Diagnosis not present

## 2019-07-28 DIAGNOSIS — Z4801 Encounter for change or removal of surgical wound dressing: Secondary | ICD-10-CM | POA: Diagnosis not present

## 2019-07-28 DIAGNOSIS — E1151 Type 2 diabetes mellitus with diabetic peripheral angiopathy without gangrene: Secondary | ICD-10-CM | POA: Diagnosis not present

## 2019-07-31 DIAGNOSIS — Z4801 Encounter for change or removal of surgical wound dressing: Secondary | ICD-10-CM | POA: Diagnosis not present

## 2019-07-31 DIAGNOSIS — E1151 Type 2 diabetes mellitus with diabetic peripheral angiopathy without gangrene: Secondary | ICD-10-CM | POA: Diagnosis not present

## 2019-07-31 DIAGNOSIS — I10 Essential (primary) hypertension: Secondary | ICD-10-CM | POA: Diagnosis not present

## 2019-07-31 DIAGNOSIS — T8189XD Other complications of procedures, not elsewhere classified, subsequent encounter: Secondary | ICD-10-CM | POA: Diagnosis not present

## 2019-08-02 DIAGNOSIS — I10 Essential (primary) hypertension: Secondary | ICD-10-CM | POA: Diagnosis not present

## 2019-08-02 DIAGNOSIS — Z4801 Encounter for change or removal of surgical wound dressing: Secondary | ICD-10-CM | POA: Diagnosis not present

## 2019-08-02 DIAGNOSIS — E1151 Type 2 diabetes mellitus with diabetic peripheral angiopathy without gangrene: Secondary | ICD-10-CM | POA: Diagnosis not present

## 2019-08-02 DIAGNOSIS — T8189XD Other complications of procedures, not elsewhere classified, subsequent encounter: Secondary | ICD-10-CM | POA: Diagnosis not present

## 2019-08-03 DIAGNOSIS — E11621 Type 2 diabetes mellitus with foot ulcer: Secondary | ICD-10-CM | POA: Diagnosis not present

## 2019-08-07 DIAGNOSIS — Z4801 Encounter for change or removal of surgical wound dressing: Secondary | ICD-10-CM | POA: Diagnosis not present

## 2019-08-07 DIAGNOSIS — T8189XD Other complications of procedures, not elsewhere classified, subsequent encounter: Secondary | ICD-10-CM | POA: Diagnosis not present

## 2019-08-07 DIAGNOSIS — I10 Essential (primary) hypertension: Secondary | ICD-10-CM | POA: Diagnosis not present

## 2019-08-07 DIAGNOSIS — E1151 Type 2 diabetes mellitus with diabetic peripheral angiopathy without gangrene: Secondary | ICD-10-CM | POA: Diagnosis not present

## 2019-08-09 DIAGNOSIS — Z4801 Encounter for change or removal of surgical wound dressing: Secondary | ICD-10-CM | POA: Diagnosis not present

## 2019-08-09 DIAGNOSIS — I10 Essential (primary) hypertension: Secondary | ICD-10-CM | POA: Diagnosis not present

## 2019-08-09 DIAGNOSIS — E1151 Type 2 diabetes mellitus with diabetic peripheral angiopathy without gangrene: Secondary | ICD-10-CM | POA: Diagnosis not present

## 2019-08-09 DIAGNOSIS — T8189XD Other complications of procedures, not elsewhere classified, subsequent encounter: Secondary | ICD-10-CM | POA: Diagnosis not present

## 2019-08-14 DIAGNOSIS — E1151 Type 2 diabetes mellitus with diabetic peripheral angiopathy without gangrene: Secondary | ICD-10-CM | POA: Diagnosis not present

## 2019-08-14 DIAGNOSIS — T8189XD Other complications of procedures, not elsewhere classified, subsequent encounter: Secondary | ICD-10-CM | POA: Diagnosis not present

## 2019-08-14 DIAGNOSIS — I10 Essential (primary) hypertension: Secondary | ICD-10-CM | POA: Diagnosis not present

## 2019-08-14 DIAGNOSIS — Z4801 Encounter for change or removal of surgical wound dressing: Secondary | ICD-10-CM | POA: Diagnosis not present

## 2019-08-16 DIAGNOSIS — E1151 Type 2 diabetes mellitus with diabetic peripheral angiopathy without gangrene: Secondary | ICD-10-CM | POA: Diagnosis not present

## 2019-08-16 DIAGNOSIS — Z4801 Encounter for change or removal of surgical wound dressing: Secondary | ICD-10-CM | POA: Diagnosis not present

## 2019-08-16 DIAGNOSIS — T8189XD Other complications of procedures, not elsewhere classified, subsequent encounter: Secondary | ICD-10-CM | POA: Diagnosis not present

## 2019-08-16 DIAGNOSIS — I10 Essential (primary) hypertension: Secondary | ICD-10-CM | POA: Diagnosis not present

## 2019-08-18 DIAGNOSIS — T8189XD Other complications of procedures, not elsewhere classified, subsequent encounter: Secondary | ICD-10-CM | POA: Diagnosis not present

## 2019-08-18 DIAGNOSIS — I10 Essential (primary) hypertension: Secondary | ICD-10-CM | POA: Diagnosis not present

## 2019-08-18 DIAGNOSIS — E1151 Type 2 diabetes mellitus with diabetic peripheral angiopathy without gangrene: Secondary | ICD-10-CM | POA: Diagnosis not present

## 2019-08-18 DIAGNOSIS — Z4801 Encounter for change or removal of surgical wound dressing: Secondary | ICD-10-CM | POA: Diagnosis not present

## 2019-08-24 DIAGNOSIS — E11621 Type 2 diabetes mellitus with foot ulcer: Secondary | ICD-10-CM | POA: Diagnosis not present

## 2019-08-25 DIAGNOSIS — Z79899 Other long term (current) drug therapy: Secondary | ICD-10-CM | POA: Diagnosis not present

## 2019-08-25 DIAGNOSIS — G894 Chronic pain syndrome: Secondary | ICD-10-CM | POA: Diagnosis not present

## 2019-08-25 DIAGNOSIS — M5136 Other intervertebral disc degeneration, lumbar region: Secondary | ICD-10-CM | POA: Diagnosis not present

## 2019-08-28 DIAGNOSIS — I10 Essential (primary) hypertension: Secondary | ICD-10-CM | POA: Diagnosis not present

## 2019-08-28 DIAGNOSIS — Z4801 Encounter for change or removal of surgical wound dressing: Secondary | ICD-10-CM | POA: Diagnosis not present

## 2019-08-28 DIAGNOSIS — E1151 Type 2 diabetes mellitus with diabetic peripheral angiopathy without gangrene: Secondary | ICD-10-CM | POA: Diagnosis not present

## 2019-08-28 DIAGNOSIS — T8189XD Other complications of procedures, not elsewhere classified, subsequent encounter: Secondary | ICD-10-CM | POA: Diagnosis not present

## 2019-08-30 DIAGNOSIS — Z4801 Encounter for change or removal of surgical wound dressing: Secondary | ICD-10-CM | POA: Diagnosis not present

## 2019-08-30 DIAGNOSIS — E1151 Type 2 diabetes mellitus with diabetic peripheral angiopathy without gangrene: Secondary | ICD-10-CM | POA: Diagnosis not present

## 2019-08-30 DIAGNOSIS — T8189XD Other complications of procedures, not elsewhere classified, subsequent encounter: Secondary | ICD-10-CM | POA: Diagnosis not present

## 2019-08-30 DIAGNOSIS — I10 Essential (primary) hypertension: Secondary | ICD-10-CM | POA: Diagnosis not present

## 2019-09-01 DIAGNOSIS — T8189XD Other complications of procedures, not elsewhere classified, subsequent encounter: Secondary | ICD-10-CM | POA: Diagnosis not present

## 2019-09-01 DIAGNOSIS — Z4801 Encounter for change or removal of surgical wound dressing: Secondary | ICD-10-CM | POA: Diagnosis not present

## 2019-09-01 DIAGNOSIS — I10 Essential (primary) hypertension: Secondary | ICD-10-CM | POA: Diagnosis not present

## 2019-09-01 DIAGNOSIS — E1151 Type 2 diabetes mellitus with diabetic peripheral angiopathy without gangrene: Secondary | ICD-10-CM | POA: Diagnosis not present

## 2019-09-04 DIAGNOSIS — T8189XD Other complications of procedures, not elsewhere classified, subsequent encounter: Secondary | ICD-10-CM | POA: Diagnosis not present

## 2019-09-05 DIAGNOSIS — Z01812 Encounter for preprocedural laboratory examination: Secondary | ICD-10-CM | POA: Diagnosis not present

## 2019-09-05 DIAGNOSIS — Z89432 Acquired absence of left foot: Secondary | ICD-10-CM | POA: Diagnosis not present

## 2019-09-05 DIAGNOSIS — Z01818 Encounter for other preprocedural examination: Secondary | ICD-10-CM | POA: Diagnosis not present

## 2019-09-05 DIAGNOSIS — L97422 Non-pressure chronic ulcer of left heel and midfoot with fat layer exposed: Secondary | ICD-10-CM | POA: Diagnosis not present

## 2019-09-05 DIAGNOSIS — E11621 Type 2 diabetes mellitus with foot ulcer: Secondary | ICD-10-CM | POA: Diagnosis not present

## 2019-09-06 DIAGNOSIS — T8189XD Other complications of procedures, not elsewhere classified, subsequent encounter: Secondary | ICD-10-CM | POA: Diagnosis not present

## 2019-09-08 DIAGNOSIS — K7460 Unspecified cirrhosis of liver: Secondary | ICD-10-CM | POA: Diagnosis not present

## 2019-09-08 DIAGNOSIS — E1140 Type 2 diabetes mellitus with diabetic neuropathy, unspecified: Secondary | ICD-10-CM | POA: Diagnosis not present

## 2019-09-08 DIAGNOSIS — E11621 Type 2 diabetes mellitus with foot ulcer: Secondary | ICD-10-CM | POA: Diagnosis not present

## 2019-09-08 DIAGNOSIS — Z884 Allergy status to anesthetic agent status: Secondary | ICD-10-CM | POA: Diagnosis not present

## 2019-09-08 DIAGNOSIS — Z89411 Acquired absence of right great toe: Secondary | ICD-10-CM | POA: Diagnosis not present

## 2019-09-08 DIAGNOSIS — L97426 Non-pressure chronic ulcer of left heel and midfoot with bone involvement without evidence of necrosis: Secondary | ICD-10-CM | POA: Diagnosis not present

## 2019-09-08 DIAGNOSIS — Z888 Allergy status to other drugs, medicaments and biological substances status: Secondary | ICD-10-CM | POA: Diagnosis not present

## 2019-09-08 DIAGNOSIS — Z7984 Long term (current) use of oral hypoglycemic drugs: Secondary | ICD-10-CM | POA: Diagnosis not present

## 2019-09-08 DIAGNOSIS — Z881 Allergy status to other antibiotic agents status: Secondary | ICD-10-CM | POA: Diagnosis not present

## 2019-09-08 DIAGNOSIS — T8740 Infection of amputation stump, unspecified extremity: Secondary | ICD-10-CM | POA: Diagnosis not present

## 2019-09-08 DIAGNOSIS — T879 Unspecified complications of amputation stump: Secondary | ICD-10-CM | POA: Diagnosis not present

## 2019-09-08 DIAGNOSIS — T8789 Other complications of amputation stump: Secondary | ICD-10-CM | POA: Diagnosis not present

## 2019-09-08 DIAGNOSIS — Z79899 Other long term (current) drug therapy: Secondary | ICD-10-CM | POA: Diagnosis not present

## 2019-09-08 DIAGNOSIS — Z79891 Long term (current) use of opiate analgesic: Secondary | ICD-10-CM | POA: Diagnosis not present

## 2019-09-08 DIAGNOSIS — I10 Essential (primary) hypertension: Secondary | ICD-10-CM | POA: Diagnosis not present

## 2019-09-15 DIAGNOSIS — Z4801 Encounter for change or removal of surgical wound dressing: Secondary | ICD-10-CM | POA: Diagnosis not present

## 2019-09-15 DIAGNOSIS — T8189XD Other complications of procedures, not elsewhere classified, subsequent encounter: Secondary | ICD-10-CM | POA: Diagnosis not present

## 2019-09-15 DIAGNOSIS — E1151 Type 2 diabetes mellitus with diabetic peripheral angiopathy without gangrene: Secondary | ICD-10-CM | POA: Diagnosis not present

## 2019-09-15 DIAGNOSIS — I10 Essential (primary) hypertension: Secondary | ICD-10-CM | POA: Diagnosis not present

## 2019-09-22 DIAGNOSIS — E1151 Type 2 diabetes mellitus with diabetic peripheral angiopathy without gangrene: Secondary | ICD-10-CM | POA: Diagnosis not present

## 2019-09-22 DIAGNOSIS — T8189XD Other complications of procedures, not elsewhere classified, subsequent encounter: Secondary | ICD-10-CM | POA: Diagnosis not present

## 2019-09-22 DIAGNOSIS — I10 Essential (primary) hypertension: Secondary | ICD-10-CM | POA: Diagnosis not present

## 2019-09-22 DIAGNOSIS — Z4801 Encounter for change or removal of surgical wound dressing: Secondary | ICD-10-CM | POA: Diagnosis not present

## 2019-09-25 DIAGNOSIS — T8189XD Other complications of procedures, not elsewhere classified, subsequent encounter: Secondary | ICD-10-CM | POA: Diagnosis not present

## 2019-09-25 DIAGNOSIS — I10 Essential (primary) hypertension: Secondary | ICD-10-CM | POA: Diagnosis not present

## 2019-09-25 DIAGNOSIS — Z79899 Other long term (current) drug therapy: Secondary | ICD-10-CM | POA: Diagnosis not present

## 2019-09-25 DIAGNOSIS — Z4801 Encounter for change or removal of surgical wound dressing: Secondary | ICD-10-CM | POA: Diagnosis not present

## 2019-09-25 DIAGNOSIS — E1151 Type 2 diabetes mellitus with diabetic peripheral angiopathy without gangrene: Secondary | ICD-10-CM | POA: Diagnosis not present

## 2019-09-25 DIAGNOSIS — G894 Chronic pain syndrome: Secondary | ICD-10-CM | POA: Diagnosis not present

## 2019-09-25 DIAGNOSIS — G546 Phantom limb syndrome with pain: Secondary | ICD-10-CM | POA: Diagnosis not present

## 2019-09-27 DIAGNOSIS — T8189XD Other complications of procedures, not elsewhere classified, subsequent encounter: Secondary | ICD-10-CM | POA: Diagnosis not present

## 2019-09-27 DIAGNOSIS — Z4801 Encounter for change or removal of surgical wound dressing: Secondary | ICD-10-CM | POA: Diagnosis not present

## 2019-09-27 DIAGNOSIS — I10 Essential (primary) hypertension: Secondary | ICD-10-CM | POA: Diagnosis not present

## 2019-09-27 DIAGNOSIS — E1151 Type 2 diabetes mellitus with diabetic peripheral angiopathy without gangrene: Secondary | ICD-10-CM | POA: Diagnosis not present

## 2019-09-29 DIAGNOSIS — E1151 Type 2 diabetes mellitus with diabetic peripheral angiopathy without gangrene: Secondary | ICD-10-CM | POA: Diagnosis not present

## 2019-09-29 DIAGNOSIS — Z4801 Encounter for change or removal of surgical wound dressing: Secondary | ICD-10-CM | POA: Diagnosis not present

## 2019-09-29 DIAGNOSIS — T8189XD Other complications of procedures, not elsewhere classified, subsequent encounter: Secondary | ICD-10-CM | POA: Diagnosis not present

## 2019-09-29 DIAGNOSIS — I10 Essential (primary) hypertension: Secondary | ICD-10-CM | POA: Diagnosis not present

## 2019-10-02 DIAGNOSIS — Z4801 Encounter for change or removal of surgical wound dressing: Secondary | ICD-10-CM | POA: Diagnosis not present

## 2019-10-02 DIAGNOSIS — I10 Essential (primary) hypertension: Secondary | ICD-10-CM | POA: Diagnosis not present

## 2019-10-02 DIAGNOSIS — E1151 Type 2 diabetes mellitus with diabetic peripheral angiopathy without gangrene: Secondary | ICD-10-CM | POA: Diagnosis not present

## 2019-10-02 DIAGNOSIS — T8189XD Other complications of procedures, not elsewhere classified, subsequent encounter: Secondary | ICD-10-CM | POA: Diagnosis not present

## 2019-10-06 DIAGNOSIS — Z4801 Encounter for change or removal of surgical wound dressing: Secondary | ICD-10-CM | POA: Diagnosis not present

## 2019-10-06 DIAGNOSIS — E1151 Type 2 diabetes mellitus with diabetic peripheral angiopathy without gangrene: Secondary | ICD-10-CM | POA: Diagnosis not present

## 2019-10-06 DIAGNOSIS — I10 Essential (primary) hypertension: Secondary | ICD-10-CM | POA: Diagnosis not present

## 2019-10-06 DIAGNOSIS — T8189XD Other complications of procedures, not elsewhere classified, subsequent encounter: Secondary | ICD-10-CM | POA: Diagnosis not present

## 2019-10-09 DIAGNOSIS — E1151 Type 2 diabetes mellitus with diabetic peripheral angiopathy without gangrene: Secondary | ICD-10-CM | POA: Diagnosis not present

## 2019-10-09 DIAGNOSIS — I10 Essential (primary) hypertension: Secondary | ICD-10-CM | POA: Diagnosis not present

## 2019-10-09 DIAGNOSIS — T8189XD Other complications of procedures, not elsewhere classified, subsequent encounter: Secondary | ICD-10-CM | POA: Diagnosis not present

## 2019-10-09 DIAGNOSIS — Z4801 Encounter for change or removal of surgical wound dressing: Secondary | ICD-10-CM | POA: Diagnosis not present

## 2019-10-11 DIAGNOSIS — E1151 Type 2 diabetes mellitus with diabetic peripheral angiopathy without gangrene: Secondary | ICD-10-CM | POA: Diagnosis not present

## 2019-10-11 DIAGNOSIS — Z4801 Encounter for change or removal of surgical wound dressing: Secondary | ICD-10-CM | POA: Diagnosis not present

## 2019-10-11 DIAGNOSIS — T8189XD Other complications of procedures, not elsewhere classified, subsequent encounter: Secondary | ICD-10-CM | POA: Diagnosis not present

## 2019-10-11 DIAGNOSIS — I10 Essential (primary) hypertension: Secondary | ICD-10-CM | POA: Diagnosis not present

## 2019-10-13 DIAGNOSIS — I10 Essential (primary) hypertension: Secondary | ICD-10-CM | POA: Diagnosis not present

## 2019-10-13 DIAGNOSIS — E1151 Type 2 diabetes mellitus with diabetic peripheral angiopathy without gangrene: Secondary | ICD-10-CM | POA: Diagnosis not present

## 2019-10-13 DIAGNOSIS — Z4801 Encounter for change or removal of surgical wound dressing: Secondary | ICD-10-CM | POA: Diagnosis not present

## 2019-10-13 DIAGNOSIS — T8189XD Other complications of procedures, not elsewhere classified, subsequent encounter: Secondary | ICD-10-CM | POA: Diagnosis not present

## 2019-10-16 DIAGNOSIS — Z4801 Encounter for change or removal of surgical wound dressing: Secondary | ICD-10-CM | POA: Diagnosis not present

## 2019-10-16 DIAGNOSIS — T8189XD Other complications of procedures, not elsewhere classified, subsequent encounter: Secondary | ICD-10-CM | POA: Diagnosis not present

## 2019-10-16 DIAGNOSIS — I10 Essential (primary) hypertension: Secondary | ICD-10-CM | POA: Diagnosis not present

## 2019-10-16 DIAGNOSIS — E1151 Type 2 diabetes mellitus with diabetic peripheral angiopathy without gangrene: Secondary | ICD-10-CM | POA: Diagnosis not present

## 2019-10-18 DIAGNOSIS — T8189XD Other complications of procedures, not elsewhere classified, subsequent encounter: Secondary | ICD-10-CM | POA: Diagnosis not present

## 2019-10-18 DIAGNOSIS — Z4801 Encounter for change or removal of surgical wound dressing: Secondary | ICD-10-CM | POA: Diagnosis not present

## 2019-10-18 DIAGNOSIS — I10 Essential (primary) hypertension: Secondary | ICD-10-CM | POA: Diagnosis not present

## 2019-10-18 DIAGNOSIS — E1151 Type 2 diabetes mellitus with diabetic peripheral angiopathy without gangrene: Secondary | ICD-10-CM | POA: Diagnosis not present

## 2019-10-20 DIAGNOSIS — T8189XD Other complications of procedures, not elsewhere classified, subsequent encounter: Secondary | ICD-10-CM | POA: Diagnosis not present

## 2019-10-20 DIAGNOSIS — E1151 Type 2 diabetes mellitus with diabetic peripheral angiopathy without gangrene: Secondary | ICD-10-CM | POA: Diagnosis not present

## 2019-10-20 DIAGNOSIS — Z4801 Encounter for change or removal of surgical wound dressing: Secondary | ICD-10-CM | POA: Diagnosis not present

## 2019-10-20 DIAGNOSIS — I10 Essential (primary) hypertension: Secondary | ICD-10-CM | POA: Diagnosis not present

## 2019-10-23 DIAGNOSIS — I10 Essential (primary) hypertension: Secondary | ICD-10-CM | POA: Diagnosis not present

## 2019-10-23 DIAGNOSIS — Z79899 Other long term (current) drug therapy: Secondary | ICD-10-CM | POA: Diagnosis not present

## 2019-10-23 DIAGNOSIS — T8189XD Other complications of procedures, not elsewhere classified, subsequent encounter: Secondary | ICD-10-CM | POA: Diagnosis not present

## 2019-10-23 DIAGNOSIS — G894 Chronic pain syndrome: Secondary | ICD-10-CM | POA: Diagnosis not present

## 2019-10-23 DIAGNOSIS — F17210 Nicotine dependence, cigarettes, uncomplicated: Secondary | ICD-10-CM | POA: Diagnosis not present

## 2019-10-23 DIAGNOSIS — Z4801 Encounter for change or removal of surgical wound dressing: Secondary | ICD-10-CM | POA: Diagnosis not present

## 2019-10-23 DIAGNOSIS — E1151 Type 2 diabetes mellitus with diabetic peripheral angiopathy without gangrene: Secondary | ICD-10-CM | POA: Diagnosis not present

## 2019-10-25 DIAGNOSIS — I10 Essential (primary) hypertension: Secondary | ICD-10-CM | POA: Diagnosis not present

## 2019-10-25 DIAGNOSIS — T8189XD Other complications of procedures, not elsewhere classified, subsequent encounter: Secondary | ICD-10-CM | POA: Diagnosis not present

## 2019-10-25 DIAGNOSIS — Z4801 Encounter for change or removal of surgical wound dressing: Secondary | ICD-10-CM | POA: Diagnosis not present

## 2019-10-25 DIAGNOSIS — E1151 Type 2 diabetes mellitus with diabetic peripheral angiopathy without gangrene: Secondary | ICD-10-CM | POA: Diagnosis not present

## 2019-10-26 DIAGNOSIS — E1159 Type 2 diabetes mellitus with other circulatory complications: Secondary | ICD-10-CM | POA: Diagnosis not present

## 2019-10-26 DIAGNOSIS — Z89511 Acquired absence of right leg below knee: Secondary | ICD-10-CM | POA: Diagnosis not present

## 2019-10-26 DIAGNOSIS — G894 Chronic pain syndrome: Secondary | ICD-10-CM | POA: Diagnosis not present

## 2019-10-26 DIAGNOSIS — I10 Essential (primary) hypertension: Secondary | ICD-10-CM | POA: Diagnosis not present

## 2019-10-26 DIAGNOSIS — K766 Portal hypertension: Secondary | ICD-10-CM | POA: Diagnosis not present

## 2019-10-26 DIAGNOSIS — E1165 Type 2 diabetes mellitus with hyperglycemia: Secondary | ICD-10-CM | POA: Diagnosis not present

## 2019-10-27 DIAGNOSIS — Z4801 Encounter for change or removal of surgical wound dressing: Secondary | ICD-10-CM | POA: Diagnosis not present

## 2019-10-27 DIAGNOSIS — E1151 Type 2 diabetes mellitus with diabetic peripheral angiopathy without gangrene: Secondary | ICD-10-CM | POA: Diagnosis not present

## 2019-10-27 DIAGNOSIS — T8189XD Other complications of procedures, not elsewhere classified, subsequent encounter: Secondary | ICD-10-CM | POA: Diagnosis not present

## 2019-10-27 DIAGNOSIS — I10 Essential (primary) hypertension: Secondary | ICD-10-CM | POA: Diagnosis not present

## 2019-10-30 DIAGNOSIS — T8189XD Other complications of procedures, not elsewhere classified, subsequent encounter: Secondary | ICD-10-CM | POA: Diagnosis not present

## 2019-10-30 DIAGNOSIS — I10 Essential (primary) hypertension: Secondary | ICD-10-CM | POA: Diagnosis not present

## 2019-10-30 DIAGNOSIS — E1151 Type 2 diabetes mellitus with diabetic peripheral angiopathy without gangrene: Secondary | ICD-10-CM | POA: Diagnosis not present

## 2019-10-30 DIAGNOSIS — Z4801 Encounter for change or removal of surgical wound dressing: Secondary | ICD-10-CM | POA: Diagnosis not present

## 2019-11-01 DIAGNOSIS — E1151 Type 2 diabetes mellitus with diabetic peripheral angiopathy without gangrene: Secondary | ICD-10-CM | POA: Diagnosis not present

## 2019-11-01 DIAGNOSIS — Z4801 Encounter for change or removal of surgical wound dressing: Secondary | ICD-10-CM | POA: Diagnosis not present

## 2019-11-01 DIAGNOSIS — T8189XD Other complications of procedures, not elsewhere classified, subsequent encounter: Secondary | ICD-10-CM | POA: Diagnosis not present

## 2019-11-01 DIAGNOSIS — I10 Essential (primary) hypertension: Secondary | ICD-10-CM | POA: Diagnosis not present

## 2019-11-08 DIAGNOSIS — I10 Essential (primary) hypertension: Secondary | ICD-10-CM | POA: Diagnosis not present

## 2019-11-08 DIAGNOSIS — T8189XD Other complications of procedures, not elsewhere classified, subsequent encounter: Secondary | ICD-10-CM | POA: Diagnosis not present

## 2019-11-08 DIAGNOSIS — Z4801 Encounter for change or removal of surgical wound dressing: Secondary | ICD-10-CM | POA: Diagnosis not present

## 2019-11-08 DIAGNOSIS — E1151 Type 2 diabetes mellitus with diabetic peripheral angiopathy without gangrene: Secondary | ICD-10-CM | POA: Diagnosis not present

## 2019-11-24 DIAGNOSIS — G894 Chronic pain syndrome: Secondary | ICD-10-CM | POA: Diagnosis not present

## 2019-11-24 DIAGNOSIS — Z79899 Other long term (current) drug therapy: Secondary | ICD-10-CM | POA: Diagnosis not present

## 2019-11-24 DIAGNOSIS — G546 Phantom limb syndrome with pain: Secondary | ICD-10-CM | POA: Diagnosis not present

## 2019-12-25 DIAGNOSIS — G894 Chronic pain syndrome: Secondary | ICD-10-CM | POA: Diagnosis not present

## 2019-12-25 DIAGNOSIS — F17210 Nicotine dependence, cigarettes, uncomplicated: Secondary | ICD-10-CM | POA: Diagnosis not present

## 2019-12-25 DIAGNOSIS — M545 Low back pain: Secondary | ICD-10-CM | POA: Diagnosis not present

## 2019-12-25 DIAGNOSIS — G546 Phantom limb syndrome with pain: Secondary | ICD-10-CM | POA: Diagnosis not present

## 2019-12-25 DIAGNOSIS — G8929 Other chronic pain: Secondary | ICD-10-CM | POA: Diagnosis not present

## 2019-12-25 DIAGNOSIS — Z79899 Other long term (current) drug therapy: Secondary | ICD-10-CM | POA: Diagnosis not present

## 2020-01-17 DIAGNOSIS — F17210 Nicotine dependence, cigarettes, uncomplicated: Secondary | ICD-10-CM | POA: Diagnosis not present

## 2020-01-17 DIAGNOSIS — Z79899 Other long term (current) drug therapy: Secondary | ICD-10-CM | POA: Diagnosis not present

## 2020-01-17 DIAGNOSIS — G894 Chronic pain syndrome: Secondary | ICD-10-CM | POA: Diagnosis not present

## 2020-01-17 DIAGNOSIS — G546 Phantom limb syndrome with pain: Secondary | ICD-10-CM | POA: Diagnosis not present

## 2020-02-23 DIAGNOSIS — Z79899 Other long term (current) drug therapy: Secondary | ICD-10-CM | POA: Diagnosis not present

## 2020-02-23 DIAGNOSIS — G894 Chronic pain syndrome: Secondary | ICD-10-CM | POA: Diagnosis not present

## 2020-02-23 DIAGNOSIS — T8789 Other complications of amputation stump: Secondary | ICD-10-CM | POA: Diagnosis not present

## 2020-02-23 DIAGNOSIS — M79609 Pain in unspecified limb: Secondary | ICD-10-CM | POA: Diagnosis not present

## 2020-02-27 DIAGNOSIS — E1165 Type 2 diabetes mellitus with hyperglycemia: Secondary | ICD-10-CM | POA: Diagnosis not present

## 2020-02-27 DIAGNOSIS — K7460 Unspecified cirrhosis of liver: Secondary | ICD-10-CM | POA: Diagnosis not present

## 2020-02-27 DIAGNOSIS — Z89511 Acquired absence of right leg below knee: Secondary | ICD-10-CM | POA: Diagnosis not present

## 2020-02-27 DIAGNOSIS — L89891 Pressure ulcer of other site, stage 1: Secondary | ICD-10-CM | POA: Diagnosis not present

## 2020-02-27 DIAGNOSIS — I152 Hypertension secondary to endocrine disorders: Secondary | ICD-10-CM | POA: Diagnosis not present

## 2020-02-27 DIAGNOSIS — E1159 Type 2 diabetes mellitus with other circulatory complications: Secondary | ICD-10-CM | POA: Diagnosis not present

## 2020-02-27 DIAGNOSIS — Z0000 Encounter for general adult medical examination without abnormal findings: Secondary | ICD-10-CM | POA: Diagnosis not present

## 2020-02-27 DIAGNOSIS — G894 Chronic pain syndrome: Secondary | ICD-10-CM | POA: Diagnosis not present

## 2020-02-27 DIAGNOSIS — K766 Portal hypertension: Secondary | ICD-10-CM | POA: Diagnosis not present

## 2020-02-28 DIAGNOSIS — L89891 Pressure ulcer of other site, stage 1: Secondary | ICD-10-CM | POA: Diagnosis not present

## 2020-02-28 DIAGNOSIS — E1151 Type 2 diabetes mellitus with diabetic peripheral angiopathy without gangrene: Secondary | ICD-10-CM | POA: Diagnosis not present

## 2020-02-28 DIAGNOSIS — I10 Essential (primary) hypertension: Secondary | ICD-10-CM | POA: Diagnosis not present

## 2020-02-28 DIAGNOSIS — Z89611 Acquired absence of right leg above knee: Secondary | ICD-10-CM | POA: Diagnosis not present

## 2020-03-01 DIAGNOSIS — Z89611 Acquired absence of right leg above knee: Secondary | ICD-10-CM | POA: Diagnosis not present

## 2020-03-01 DIAGNOSIS — I10 Essential (primary) hypertension: Secondary | ICD-10-CM | POA: Diagnosis not present

## 2020-03-01 DIAGNOSIS — L89891 Pressure ulcer of other site, stage 1: Secondary | ICD-10-CM | POA: Diagnosis not present

## 2020-03-01 DIAGNOSIS — E1151 Type 2 diabetes mellitus with diabetic peripheral angiopathy without gangrene: Secondary | ICD-10-CM | POA: Diagnosis not present

## 2020-03-04 DIAGNOSIS — E1151 Type 2 diabetes mellitus with diabetic peripheral angiopathy without gangrene: Secondary | ICD-10-CM | POA: Diagnosis not present

## 2020-03-04 DIAGNOSIS — I10 Essential (primary) hypertension: Secondary | ICD-10-CM | POA: Diagnosis not present

## 2020-03-04 DIAGNOSIS — Z89611 Acquired absence of right leg above knee: Secondary | ICD-10-CM | POA: Diagnosis not present

## 2020-03-04 DIAGNOSIS — L89891 Pressure ulcer of other site, stage 1: Secondary | ICD-10-CM | POA: Diagnosis not present

## 2020-03-06 DIAGNOSIS — Z89611 Acquired absence of right leg above knee: Secondary | ICD-10-CM | POA: Diagnosis not present

## 2020-03-06 DIAGNOSIS — E1151 Type 2 diabetes mellitus with diabetic peripheral angiopathy without gangrene: Secondary | ICD-10-CM | POA: Diagnosis not present

## 2020-03-06 DIAGNOSIS — L89891 Pressure ulcer of other site, stage 1: Secondary | ICD-10-CM | POA: Diagnosis not present

## 2020-03-06 DIAGNOSIS — I10 Essential (primary) hypertension: Secondary | ICD-10-CM | POA: Diagnosis not present

## 2020-03-08 DIAGNOSIS — Z89611 Acquired absence of right leg above knee: Secondary | ICD-10-CM | POA: Diagnosis not present

## 2020-03-08 DIAGNOSIS — I10 Essential (primary) hypertension: Secondary | ICD-10-CM | POA: Diagnosis not present

## 2020-03-08 DIAGNOSIS — E1151 Type 2 diabetes mellitus with diabetic peripheral angiopathy without gangrene: Secondary | ICD-10-CM | POA: Diagnosis not present

## 2020-03-08 DIAGNOSIS — L89891 Pressure ulcer of other site, stage 1: Secondary | ICD-10-CM | POA: Diagnosis not present

## 2020-03-11 DIAGNOSIS — L89891 Pressure ulcer of other site, stage 1: Secondary | ICD-10-CM | POA: Diagnosis not present

## 2020-03-11 DIAGNOSIS — E1151 Type 2 diabetes mellitus with diabetic peripheral angiopathy without gangrene: Secondary | ICD-10-CM | POA: Diagnosis not present

## 2020-03-11 DIAGNOSIS — Z89611 Acquired absence of right leg above knee: Secondary | ICD-10-CM | POA: Diagnosis not present

## 2020-03-11 DIAGNOSIS — I10 Essential (primary) hypertension: Secondary | ICD-10-CM | POA: Diagnosis not present

## 2020-03-13 DIAGNOSIS — E1151 Type 2 diabetes mellitus with diabetic peripheral angiopathy without gangrene: Secondary | ICD-10-CM | POA: Diagnosis not present

## 2020-03-13 DIAGNOSIS — L89891 Pressure ulcer of other site, stage 1: Secondary | ICD-10-CM | POA: Diagnosis not present

## 2020-03-13 DIAGNOSIS — I10 Essential (primary) hypertension: Secondary | ICD-10-CM | POA: Diagnosis not present

## 2020-03-13 DIAGNOSIS — Z89611 Acquired absence of right leg above knee: Secondary | ICD-10-CM | POA: Diagnosis not present

## 2020-03-15 DIAGNOSIS — E1151 Type 2 diabetes mellitus with diabetic peripheral angiopathy without gangrene: Secondary | ICD-10-CM | POA: Diagnosis not present

## 2020-03-15 DIAGNOSIS — L89891 Pressure ulcer of other site, stage 1: Secondary | ICD-10-CM | POA: Diagnosis not present

## 2020-03-15 DIAGNOSIS — I10 Essential (primary) hypertension: Secondary | ICD-10-CM | POA: Diagnosis not present

## 2020-03-15 DIAGNOSIS — Z89611 Acquired absence of right leg above knee: Secondary | ICD-10-CM | POA: Diagnosis not present

## 2020-03-18 DIAGNOSIS — E1151 Type 2 diabetes mellitus with diabetic peripheral angiopathy without gangrene: Secondary | ICD-10-CM | POA: Diagnosis not present

## 2020-03-18 DIAGNOSIS — I10 Essential (primary) hypertension: Secondary | ICD-10-CM | POA: Diagnosis not present

## 2020-03-18 DIAGNOSIS — L89891 Pressure ulcer of other site, stage 1: Secondary | ICD-10-CM | POA: Diagnosis not present

## 2020-03-18 DIAGNOSIS — Z89611 Acquired absence of right leg above knee: Secondary | ICD-10-CM | POA: Diagnosis not present

## 2020-03-20 DIAGNOSIS — I10 Essential (primary) hypertension: Secondary | ICD-10-CM | POA: Diagnosis not present

## 2020-03-20 DIAGNOSIS — E559 Vitamin D deficiency, unspecified: Secondary | ICD-10-CM | POA: Diagnosis not present

## 2020-03-20 DIAGNOSIS — Z89611 Acquired absence of right leg above knee: Secondary | ICD-10-CM | POA: Diagnosis not present

## 2020-03-20 DIAGNOSIS — G894 Chronic pain syndrome: Secondary | ICD-10-CM | POA: Diagnosis not present

## 2020-03-20 DIAGNOSIS — Z79899 Other long term (current) drug therapy: Secondary | ICD-10-CM | POA: Diagnosis not present

## 2020-03-20 DIAGNOSIS — M79606 Pain in leg, unspecified: Secondary | ICD-10-CM | POA: Diagnosis not present

## 2020-03-20 DIAGNOSIS — L89891 Pressure ulcer of other site, stage 1: Secondary | ICD-10-CM | POA: Diagnosis not present

## 2020-03-20 DIAGNOSIS — G8929 Other chronic pain: Secondary | ICD-10-CM | POA: Diagnosis not present

## 2020-03-20 DIAGNOSIS — E1151 Type 2 diabetes mellitus with diabetic peripheral angiopathy without gangrene: Secondary | ICD-10-CM | POA: Diagnosis not present

## 2020-03-26 DIAGNOSIS — E1151 Type 2 diabetes mellitus with diabetic peripheral angiopathy without gangrene: Secondary | ICD-10-CM | POA: Diagnosis not present

## 2020-03-26 DIAGNOSIS — Z89611 Acquired absence of right leg above knee: Secondary | ICD-10-CM | POA: Diagnosis not present

## 2020-03-26 DIAGNOSIS — I10 Essential (primary) hypertension: Secondary | ICD-10-CM | POA: Diagnosis not present

## 2020-03-26 DIAGNOSIS — L89891 Pressure ulcer of other site, stage 1: Secondary | ICD-10-CM | POA: Diagnosis not present

## 2020-03-28 DIAGNOSIS — L89891 Pressure ulcer of other site, stage 1: Secondary | ICD-10-CM | POA: Diagnosis not present

## 2020-03-28 DIAGNOSIS — I10 Essential (primary) hypertension: Secondary | ICD-10-CM | POA: Diagnosis not present

## 2020-03-28 DIAGNOSIS — Z89611 Acquired absence of right leg above knee: Secondary | ICD-10-CM | POA: Diagnosis not present

## 2020-03-28 DIAGNOSIS — E1151 Type 2 diabetes mellitus with diabetic peripheral angiopathy without gangrene: Secondary | ICD-10-CM | POA: Diagnosis not present

## 2020-04-04 DIAGNOSIS — E1151 Type 2 diabetes mellitus with diabetic peripheral angiopathy without gangrene: Secondary | ICD-10-CM | POA: Diagnosis not present

## 2020-04-04 DIAGNOSIS — Z89611 Acquired absence of right leg above knee: Secondary | ICD-10-CM | POA: Diagnosis not present

## 2020-04-04 DIAGNOSIS — I10 Essential (primary) hypertension: Secondary | ICD-10-CM | POA: Diagnosis not present

## 2020-04-04 DIAGNOSIS — L89891 Pressure ulcer of other site, stage 1: Secondary | ICD-10-CM | POA: Diagnosis not present

## 2020-04-09 DIAGNOSIS — I10 Essential (primary) hypertension: Secondary | ICD-10-CM | POA: Diagnosis not present

## 2020-04-09 DIAGNOSIS — E1151 Type 2 diabetes mellitus with diabetic peripheral angiopathy without gangrene: Secondary | ICD-10-CM | POA: Diagnosis not present

## 2020-04-09 DIAGNOSIS — Z89611 Acquired absence of right leg above knee: Secondary | ICD-10-CM | POA: Diagnosis not present

## 2020-04-09 DIAGNOSIS — L89891 Pressure ulcer of other site, stage 1: Secondary | ICD-10-CM | POA: Diagnosis not present

## 2020-04-25 DIAGNOSIS — M79606 Pain in leg, unspecified: Secondary | ICD-10-CM | POA: Diagnosis not present

## 2020-04-25 DIAGNOSIS — Z79899 Other long term (current) drug therapy: Secondary | ICD-10-CM | POA: Diagnosis not present

## 2020-04-25 DIAGNOSIS — G894 Chronic pain syndrome: Secondary | ICD-10-CM | POA: Diagnosis not present

## 2020-04-25 DIAGNOSIS — G8929 Other chronic pain: Secondary | ICD-10-CM | POA: Diagnosis not present

## 2020-05-24 DIAGNOSIS — Z79899 Other long term (current) drug therapy: Secondary | ICD-10-CM | POA: Diagnosis not present

## 2020-05-24 DIAGNOSIS — M79606 Pain in leg, unspecified: Secondary | ICD-10-CM | POA: Diagnosis not present

## 2020-05-24 DIAGNOSIS — G894 Chronic pain syndrome: Secondary | ICD-10-CM | POA: Diagnosis not present

## 2020-05-24 DIAGNOSIS — G8929 Other chronic pain: Secondary | ICD-10-CM | POA: Diagnosis not present

## 2020-06-24 DIAGNOSIS — G894 Chronic pain syndrome: Secondary | ICD-10-CM | POA: Diagnosis not present

## 2020-06-24 DIAGNOSIS — G8929 Other chronic pain: Secondary | ICD-10-CM | POA: Diagnosis not present

## 2020-06-24 DIAGNOSIS — M79606 Pain in leg, unspecified: Secondary | ICD-10-CM | POA: Diagnosis not present

## 2020-06-24 DIAGNOSIS — Z79899 Other long term (current) drug therapy: Secondary | ICD-10-CM | POA: Diagnosis not present

## 2020-07-08 DIAGNOSIS — Z89511 Acquired absence of right leg below knee: Secondary | ICD-10-CM | POA: Diagnosis not present

## 2020-07-24 DIAGNOSIS — E1165 Type 2 diabetes mellitus with hyperglycemia: Secondary | ICD-10-CM | POA: Diagnosis not present

## 2020-07-24 DIAGNOSIS — Z79899 Other long term (current) drug therapy: Secondary | ICD-10-CM | POA: Diagnosis not present

## 2020-07-24 DIAGNOSIS — G894 Chronic pain syndrome: Secondary | ICD-10-CM | POA: Diagnosis not present

## 2020-07-24 DIAGNOSIS — G546 Phantom limb syndrome with pain: Secondary | ICD-10-CM | POA: Diagnosis not present

## 2020-07-24 DIAGNOSIS — M5136 Other intervertebral disc degeneration, lumbar region: Secondary | ICD-10-CM | POA: Diagnosis not present

## 2020-07-24 DIAGNOSIS — E559 Vitamin D deficiency, unspecified: Secondary | ICD-10-CM | POA: Diagnosis not present

## 2020-07-24 DIAGNOSIS — Z23 Encounter for immunization: Secondary | ICD-10-CM | POA: Diagnosis not present

## 2020-08-01 DIAGNOSIS — Z89511 Acquired absence of right leg below knee: Secondary | ICD-10-CM | POA: Diagnosis not present

## 2022-02-18 ENCOUNTER — Other Ambulatory Visit: Payer: Self-pay

## 2022-02-18 ENCOUNTER — Other Ambulatory Visit (HOSPITAL_COMMUNITY): Payer: Self-pay

## 2022-02-18 MED ORDER — OXYCODONE HCL 15 MG PO TABS
ORAL_TABLET | ORAL | 0 refills | Status: AC
  Filled 2022-02-18: qty 150, 30d supply, fill #0

## 2022-03-19 ENCOUNTER — Other Ambulatory Visit (HOSPITAL_COMMUNITY): Payer: Self-pay

## 2022-03-19 MED ORDER — OXYCODONE HCL 15 MG PO TABS
ORAL_TABLET | ORAL | 0 refills | Status: AC
  Filled 2022-03-19: qty 150, 30d supply, fill #0
  Filled 2022-03-20: qty 25, 5d supply, fill #0
  Filled 2022-03-20: qty 125, 25d supply, fill #0

## 2022-03-20 ENCOUNTER — Other Ambulatory Visit (HOSPITAL_COMMUNITY): Payer: Self-pay

## 2022-03-23 ENCOUNTER — Other Ambulatory Visit (HOSPITAL_COMMUNITY): Payer: Self-pay

## 2022-03-24 ENCOUNTER — Other Ambulatory Visit (HOSPITAL_COMMUNITY): Payer: Self-pay

## 2022-04-17 ENCOUNTER — Other Ambulatory Visit (HOSPITAL_COMMUNITY): Payer: Self-pay

## 2022-04-17 MED ORDER — OXYCODONE HCL 15 MG PO TABS
15.0000 mg | ORAL_TABLET | Freq: Every day | ORAL | 0 refills | Status: AC
  Filled 2022-04-17: qty 20, 4d supply, fill #0
  Filled 2022-04-18: qty 150, 30d supply, fill #0
  Filled ????-??-??: fill #0

## 2022-04-18 ENCOUNTER — Other Ambulatory Visit (HOSPITAL_COMMUNITY): Payer: Self-pay

## 2022-04-20 ENCOUNTER — Other Ambulatory Visit (HOSPITAL_COMMUNITY): Payer: Self-pay

## 2022-04-20 MED ORDER — OXYCODONE HCL 15 MG PO TABS
15.0000 mg | ORAL_TABLET | Freq: Every day | ORAL | 0 refills | Status: AC
  Filled 2022-04-20 – 2022-07-20 (×2): qty 150, 30d supply, fill #0

## 2022-05-16 ENCOUNTER — Other Ambulatory Visit (HOSPITAL_COMMUNITY): Payer: Self-pay

## 2022-05-16 MED ORDER — OXYCODONE HCL 15 MG PO TABS
15.0000 mg | ORAL_TABLET | Freq: Every day | ORAL | 0 refills | Status: AC
  Filled 2022-05-20: qty 150, 30d supply, fill #0

## 2022-05-18 ENCOUNTER — Other Ambulatory Visit (HOSPITAL_COMMUNITY): Payer: Self-pay

## 2022-05-20 ENCOUNTER — Other Ambulatory Visit (HOSPITAL_COMMUNITY): Payer: Self-pay

## 2022-06-17 ENCOUNTER — Other Ambulatory Visit (HOSPITAL_COMMUNITY): Payer: Self-pay

## 2022-06-17 MED ORDER — OXYCODONE HCL 15 MG PO TABS
15.0000 mg | ORAL_TABLET | Freq: Every day | ORAL | 0 refills | Status: AC | PRN
  Filled 2022-06-19: qty 150, 30d supply, fill #0

## 2022-06-19 ENCOUNTER — Other Ambulatory Visit (HOSPITAL_COMMUNITY): Payer: Self-pay

## 2022-07-20 ENCOUNTER — Other Ambulatory Visit (HOSPITAL_COMMUNITY): Payer: Self-pay

## 2022-07-21 ENCOUNTER — Other Ambulatory Visit (HOSPITAL_COMMUNITY): Payer: Self-pay

## 2022-07-21 MED ORDER — OXYCODONE HCL 15 MG PO TABS
15.0000 mg | ORAL_TABLET | Freq: Every day | ORAL | 0 refills | Status: AC | PRN
  Filled 2022-07-21: qty 150, 30d supply, fill #0

## 2022-08-17 ENCOUNTER — Other Ambulatory Visit (HOSPITAL_COMMUNITY): Payer: Self-pay

## 2022-08-17 MED ORDER — OXYCODONE HCL 15 MG PO TABS
ORAL_TABLET | ORAL | 0 refills | Status: AC
  Filled 2022-08-17: qty 150, 30d supply, fill #0

## 2022-08-18 ENCOUNTER — Other Ambulatory Visit (HOSPITAL_COMMUNITY): Payer: Self-pay

## 2022-08-26 ENCOUNTER — Other Ambulatory Visit (HOSPITAL_COMMUNITY): Payer: Self-pay

## 2022-08-26 MED ORDER — DOXYCYCLINE HYCLATE 100 MG PO TABS
100.0000 mg | ORAL_TABLET | Freq: Two times a day (BID) | ORAL | 0 refills | Status: AC
  Filled 2022-08-26: qty 20, 10d supply, fill #0

## 2022-09-17 ENCOUNTER — Other Ambulatory Visit (HOSPITAL_COMMUNITY): Payer: Self-pay

## 2022-09-17 MED ORDER — OXYCODONE HCL 15 MG PO TABS
15.0000 mg | ORAL_TABLET | Freq: Every day | ORAL | 0 refills | Status: AC | PRN
  Filled 2022-09-17: qty 150, 30d supply, fill #0

## 2022-09-17 MED ORDER — DIAZEPAM 2 MG PO TABS
2.0000 mg | ORAL_TABLET | Freq: Four times a day (QID) | ORAL | 0 refills | Status: DC | PRN
  Filled 2022-09-17: qty 120, 30d supply, fill #0

## 2022-09-18 ENCOUNTER — Other Ambulatory Visit (HOSPITAL_COMMUNITY): Payer: Self-pay

## 2022-10-17 ENCOUNTER — Other Ambulatory Visit (HOSPITAL_COMMUNITY): Payer: Self-pay

## 2022-10-17 MED ORDER — DIAZEPAM 2 MG PO TABS
2.0000 mg | ORAL_TABLET | Freq: Four times a day (QID) | ORAL | 0 refills | Status: DC | PRN
  Filled 2022-10-17 – 2022-10-19 (×3): qty 120, 30d supply, fill #0

## 2022-10-17 MED ORDER — OXYCODONE HCL 15 MG PO TABS
15.0000 mg | ORAL_TABLET | Freq: Every day | ORAL | 0 refills | Status: AC | PRN
  Filled 2022-10-17: qty 150, 30d supply, fill #0

## 2022-10-19 ENCOUNTER — Other Ambulatory Visit: Payer: Self-pay

## 2022-10-19 ENCOUNTER — Other Ambulatory Visit (HOSPITAL_COMMUNITY): Payer: Self-pay

## 2022-10-20 ENCOUNTER — Other Ambulatory Visit (HOSPITAL_COMMUNITY): Payer: Self-pay

## 2022-11-15 ENCOUNTER — Other Ambulatory Visit (HOSPITAL_COMMUNITY): Payer: Self-pay

## 2022-11-15 MED ORDER — OXYCODONE HCL 15 MG PO TABS
15.0000 mg | ORAL_TABLET | Freq: Every day | ORAL | 0 refills | Status: AC | PRN
  Filled 2022-11-15 – 2022-11-16 (×2): qty 150, 30d supply, fill #0

## 2022-11-15 MED ORDER — DIAZEPAM 2 MG PO TABS
2.0000 mg | ORAL_TABLET | Freq: Four times a day (QID) | ORAL | 0 refills | Status: DC | PRN
  Filled 2022-11-15 – 2022-11-19 (×2): qty 120, 30d supply, fill #0

## 2022-11-16 ENCOUNTER — Other Ambulatory Visit (HOSPITAL_COMMUNITY): Payer: Self-pay

## 2022-11-19 ENCOUNTER — Other Ambulatory Visit (HOSPITAL_COMMUNITY): Payer: Self-pay

## 2022-11-19 ENCOUNTER — Other Ambulatory Visit: Payer: Self-pay

## 2022-12-16 ENCOUNTER — Other Ambulatory Visit (HOSPITAL_COMMUNITY): Payer: Self-pay

## 2022-12-16 MED ORDER — DIAZEPAM 2 MG PO TABS: 2.0000 mg | ORAL_TABLET | Freq: Four times a day (QID) | ORAL | 0 refills | Status: DC | PRN

## 2022-12-16 MED ORDER — OXYCODONE HCL 15 MG PO TABS
ORAL_TABLET | ORAL | 0 refills | Status: AC
  Filled 2022-12-16: qty 150, 30d supply, fill #0

## 2023-01-15 ENCOUNTER — Other Ambulatory Visit (HOSPITAL_COMMUNITY): Payer: Self-pay

## 2023-01-15 ENCOUNTER — Other Ambulatory Visit: Payer: Self-pay

## 2023-01-15 MED ORDER — DIAZEPAM 2 MG PO TABS
2.0000 mg | ORAL_TABLET | Freq: Four times a day (QID) | ORAL | 0 refills | Status: DC | PRN
  Filled 2023-01-15: qty 120, 30d supply, fill #0

## 2023-01-15 MED ORDER — OXYCODONE HCL 15 MG PO TABS
15.0000 mg | ORAL_TABLET | Freq: Every day | ORAL | 0 refills | Status: AC | PRN
  Filled 2023-01-15: qty 150, 30d supply, fill #0

## 2023-02-12 ENCOUNTER — Other Ambulatory Visit (HOSPITAL_COMMUNITY): Payer: Self-pay

## 2023-02-12 MED ORDER — DIAZEPAM 2 MG PO TABS
2.0000 mg | ORAL_TABLET | Freq: Four times a day (QID) | ORAL | 0 refills | Status: AC | PRN
  Filled 2023-02-12: qty 120, 30d supply, fill #0
  Filled 2023-02-15: qty 48, 12d supply, fill #0
  Filled 2023-02-15: qty 120, 30d supply, fill #0
  Filled 2023-02-26 (×2): qty 48, 12d supply, fill #1
  Filled 2023-03-13: qty 24, 6d supply, fill #2
  Filled 2023-03-13: qty 16, 4d supply, fill #2
  Filled 2023-03-13: qty 8, 2d supply, fill #2
  Filled 2023-03-13: qty 24, 6d supply, fill #2

## 2023-02-12 MED ORDER — BUPRENORPHINE 5 MCG/HR TD PTWK
1.0000 | MEDICATED_PATCH | TRANSDERMAL | 0 refills | Status: AC
  Filled 2023-02-12: qty 4, 28d supply, fill #0

## 2023-02-12 MED ORDER — OXYCODONE HCL 15 MG PO TABS
15.0000 mg | ORAL_TABLET | Freq: Every day | ORAL | 0 refills | Status: AC
  Filled 2023-02-12 – 2023-02-15 (×3): qty 150, 30d supply, fill #0

## 2023-02-15 ENCOUNTER — Other Ambulatory Visit (HOSPITAL_COMMUNITY): Payer: Self-pay

## 2023-02-22 ENCOUNTER — Other Ambulatory Visit (HOSPITAL_COMMUNITY): Payer: Self-pay

## 2023-02-26 ENCOUNTER — Other Ambulatory Visit: Payer: Self-pay

## 2023-02-26 ENCOUNTER — Other Ambulatory Visit (HOSPITAL_COMMUNITY): Payer: Self-pay

## 2023-03-13 ENCOUNTER — Other Ambulatory Visit (HOSPITAL_COMMUNITY): Payer: Self-pay

## 2023-03-15 ENCOUNTER — Other Ambulatory Visit (HOSPITAL_COMMUNITY): Payer: Self-pay

## 2023-03-16 ENCOUNTER — Other Ambulatory Visit (HOSPITAL_COMMUNITY): Payer: Self-pay

## 2023-03-16 MED ORDER — BUPRENORPHINE 5 MCG/HR TD PTWK
1.0000 | MEDICATED_PATCH | TRANSDERMAL | 0 refills | Status: AC
  Filled 2023-03-16: qty 4, 28d supply, fill #0

## 2023-03-16 MED ORDER — OXYCODONE HCL 15 MG PO TABS
15.0000 mg | ORAL_TABLET | Freq: Every day | ORAL | 0 refills | Status: AC | PRN
  Filled 2023-03-16: qty 150, 30d supply, fill #0

## 2023-03-16 MED ORDER — DIAZEPAM 5 MG PO TABS
5.0000 mg | ORAL_TABLET | Freq: Three times a day (TID) | ORAL | 0 refills | Status: AC | PRN
  Filled 2023-03-16: qty 90, 30d supply, fill #0

## 2023-03-17 ENCOUNTER — Other Ambulatory Visit (HOSPITAL_COMMUNITY): Payer: Self-pay

## 2023-03-25 ENCOUNTER — Other Ambulatory Visit (HOSPITAL_COMMUNITY): Payer: Self-pay

## 2023-04-15 ENCOUNTER — Other Ambulatory Visit (HOSPITAL_COMMUNITY): Payer: Self-pay

## 2023-04-15 MED ORDER — DIAZEPAM 5 MG PO TABS
5.0000 mg | ORAL_TABLET | Freq: Three times a day (TID) | ORAL | 0 refills | Status: AC | PRN
  Filled 2023-04-15: qty 90, 30d supply, fill #0

## 2023-04-15 MED ORDER — OXYCODONE HCL 15 MG PO TABS
15.0000 mg | ORAL_TABLET | Freq: Every day | ORAL | 0 refills | Status: AC | PRN
  Filled 2023-04-15: qty 150, 30d supply, fill #0

## 2023-04-15 MED ORDER — BUPRENORPHINE 5 MCG/HR TD PTWK
1.0000 | MEDICATED_PATCH | TRANSDERMAL | 0 refills | Status: AC
  Filled 2023-04-15: qty 4, 28d supply, fill #0

## 2023-05-14 ENCOUNTER — Other Ambulatory Visit (HOSPITAL_COMMUNITY): Payer: Self-pay

## 2023-05-14 MED ORDER — OXYCODONE HCL 15 MG PO TABS
15.0000 mg | ORAL_TABLET | Freq: Every day | ORAL | 0 refills | Status: AC
  Filled 2023-05-14 – 2023-05-17 (×2): qty 150, 30d supply, fill #0

## 2023-05-14 MED ORDER — BUPRENORPHINE 5 MCG/HR TD PTWK
MEDICATED_PATCH | TRANSDERMAL | 0 refills | Status: AC
  Filled 2023-05-14: qty 4, 30d supply, fill #0

## 2023-05-14 MED ORDER — DIAZEPAM 5 MG PO TABS
5.0000 mg | ORAL_TABLET | Freq: Three times a day (TID) | ORAL | 0 refills | Status: AC
  Filled 2023-05-14 – 2023-05-17 (×2): qty 90, 30d supply, fill #0

## 2023-05-15 ENCOUNTER — Other Ambulatory Visit (HOSPITAL_COMMUNITY): Payer: Self-pay

## 2023-05-17 ENCOUNTER — Other Ambulatory Visit: Payer: Self-pay

## 2023-05-17 ENCOUNTER — Other Ambulatory Visit (HOSPITAL_COMMUNITY): Payer: Self-pay

## 2023-06-15 ENCOUNTER — Other Ambulatory Visit (HOSPITAL_COMMUNITY): Payer: Self-pay

## 2023-06-15 MED ORDER — OXYCODONE HCL 15 MG PO TABS
15.0000 mg | ORAL_TABLET | Freq: Every day | ORAL | 0 refills | Status: AC
  Filled 2023-06-15 – 2023-06-16 (×2): qty 150, 30d supply, fill #0

## 2023-06-15 MED ORDER — DIAZEPAM 5 MG PO TABS
5.0000 mg | ORAL_TABLET | Freq: Three times a day (TID) | ORAL | 0 refills | Status: AC | PRN
  Filled 2023-06-15 – 2023-06-16 (×2): qty 90, 30d supply, fill #0

## 2023-06-16 ENCOUNTER — Other Ambulatory Visit (HOSPITAL_COMMUNITY): Payer: Self-pay

## 2023-10-09 DEATH — deceased
